# Patient Record
Sex: Male | Born: 1960 | Race: Black or African American | Hispanic: No | Marital: Single | Smoking: Current every day smoker
Health system: Southern US, Community
[De-identification: ages and names within clinical notes are randomized; demographics above are authoritative.]

## PROBLEM LIST (undated history)

## (undated) DIAGNOSIS — R011 Cardiac murmur, unspecified: Secondary | ICD-10-CM

## (undated) DIAGNOSIS — F1910 Other psychoactive substance abuse, uncomplicated: Secondary | ICD-10-CM

## (undated) DIAGNOSIS — M109 Gout, unspecified: Secondary | ICD-10-CM

## (undated) DIAGNOSIS — I10 Essential (primary) hypertension: Secondary | ICD-10-CM

## (undated) HISTORY — DX: Gout, unspecified: M10.9

## (undated) HISTORY — PX: APPENDECTOMY: SHX54

## (undated) HISTORY — DX: Cardiac murmur, unspecified: R01.1

---

## 2005-05-06 ENCOUNTER — Emergency Department: Payer: Self-pay | Admitting: Emergency Medicine

## 2005-12-12 ENCOUNTER — Emergency Department: Payer: Self-pay | Admitting: Emergency Medicine

## 2007-10-08 ENCOUNTER — Emergency Department: Payer: Self-pay | Admitting: Emergency Medicine

## 2008-02-20 ENCOUNTER — Emergency Department: Payer: Self-pay | Admitting: Emergency Medicine

## 2016-04-17 ENCOUNTER — Inpatient Hospital Stay
Admission: EM | Admit: 2016-04-17 | Discharge: 2016-04-19 | DRG: 812 | Disposition: A | Discharge status: Home / Self Care | Payer: BLUE CROSS/BLUE SHIELD | Attending: Internal Medicine | Admitting: Internal Medicine

## 2016-04-17 ENCOUNTER — Encounter: Payer: Self-pay | Admitting: Emergency Medicine

## 2016-04-17 ENCOUNTER — Emergency Department: Payer: BLUE CROSS/BLUE SHIELD

## 2016-04-17 DIAGNOSIS — I10 Essential (primary) hypertension: Secondary | ICD-10-CM | POA: Diagnosis present

## 2016-04-17 DIAGNOSIS — R072 Precordial pain: Secondary | ICD-10-CM | POA: Diagnosis not present

## 2016-04-17 DIAGNOSIS — R0789 Other chest pain: Secondary | ICD-10-CM | POA: Diagnosis present

## 2016-04-17 DIAGNOSIS — R079 Chest pain, unspecified: Secondary | ICD-10-CM | POA: Diagnosis present

## 2016-04-17 DIAGNOSIS — D509 Iron deficiency anemia, unspecified: Principal | ICD-10-CM | POA: Diagnosis present

## 2016-04-17 DIAGNOSIS — K3189 Other diseases of stomach and duodenum: Secondary | ICD-10-CM

## 2016-04-17 DIAGNOSIS — F17210 Nicotine dependence, cigarettes, uncomplicated: Secondary | ICD-10-CM | POA: Diagnosis present

## 2016-04-17 DIAGNOSIS — K921 Melena: Secondary | ICD-10-CM | POA: Diagnosis not present

## 2016-04-17 DIAGNOSIS — Z8249 Family history of ischemic heart disease and other diseases of the circulatory system: Secondary | ICD-10-CM | POA: Diagnosis not present

## 2016-04-17 DIAGNOSIS — D649 Anemia, unspecified: Secondary | ICD-10-CM

## 2016-04-17 HISTORY — DX: Essential (primary) hypertension: I10

## 2016-04-17 HISTORY — DX: Other psychoactive substance abuse, uncomplicated: F19.10

## 2016-04-17 LAB — TROPONIN I: Troponin I: <0.03 ng/mL (ref <0.03)

## 2016-04-17 LAB — BASIC METABOLIC PANEL
ANION GAP: 10 (ref 5–15)
BUN: 8 mg/dL (ref 6–20)
CALCIUM: 9.1 mg/dL (ref 8.9–10.3)
CO2: 21 mmol/L — AB (ref 22–32)
Chloride: 107 mmol/L (ref 101–111)
Creatinine, Ser: 0.77 mg/dL (ref 0.61–1.24)
GFR calc Af Amer: >60 mL/min (ref >60)
GFR calc non Af Amer: >60 mL/min (ref >60)
GLUCOSE: 90 mg/dL (ref 65–99)
Potassium: 4.1 mmol/L (ref 3.5–5.1)
Sodium: 138 mmol/L (ref 135–145)

## 2016-04-17 LAB — CBC
HEMATOCRIT: 26.6 % — AB (ref 40.0–52.0)
HEMOGLOBIN: 7.9 g/dL — AB (ref 13.0–18.0)
MCH: 19.9 pg — AB (ref 26.0–34.0)
MCHC: 29.7 g/dL — AB (ref 32.0–36.0)
MCV: 67 fL — ABNORMAL LOW (ref 80.0–100.0)
Platelets: 242 10*3/uL (ref 150–440)
RBC: 3.98 MIL/uL — ABNORMAL LOW (ref 4.40–5.90)
RDW: 20.2 % — AB (ref 11.5–14.5)
WBC: 10.1 10*3/uL (ref 3.8–10.6)

## 2016-04-17 LAB — ABO/RH: ABO/RH(D): A POS

## 2016-04-17 MED ORDER — SODIUM CHLORIDE 0.9 % IV SOLN
10.0000 mL/h | Freq: Once | INTRAVENOUS | Status: AC
  Administered 2016-04-17: 10 mL/h via INTRAVENOUS

## 2016-04-17 MED ORDER — MORPHINE SULFATE (PF) 4 MG/ML IV SOLN
4.0000 mg | Freq: Once | INTRAVENOUS | Status: AC
  Administered 2016-04-17: 4 mg via INTRAVENOUS

## 2016-04-17 MED ORDER — IOPAMIDOL (ISOVUE-370) INJECTION 76%
75.0000 mL | Freq: Once | INTRAVENOUS | Status: AC | PRN
  Administered 2016-04-17: 75 mL via INTRAVENOUS

## 2016-04-17 MED ORDER — MORPHINE SULFATE (PF) 4 MG/ML IV SOLN
INTRAVENOUS | Status: AC
  Filled 2016-04-17: qty 1

## 2016-04-17 NOTE — H&P (Signed)
[facility] [place] NAME: Dillon Castillo    MR#:  [id number]  DATE OF BIRTH:  03-14-1961  DATE OF ADMISSION:  04/17/2016  PRIMARY CARE PHYSICIAN: No PCP Per Patient   REQUESTING/REFERRING PHYSICIAN: Alfred Levins, MD  CHIEF COMPLAINT:   Chief Complaint  Patient presents with  . Chest Pain    HISTORY OF PRESENT ILLNESS:  Dillon Castillo  is a 55 y.o. male who presents with An episode of central chest discomfort which radiated through to his back. This started while he was driving to work today, but he noticed that it was exacerbated with any exertion and relieved some with rest. He came to the ED for evaluation was found to be significantly anemic, with a hemoglobin of 7.9. He has no prior known history of anemia. He does state that he had some diarrhea yesterday, and that his first stool was large volume and black. Subsequent stool was still diarrhea, but brown. Initial cardiac workup in the ED is negative, but given his symptomatic anemia hospitalists were called for admission  PAST MEDICAL HISTORY:   Past Medical History:  Diagnosis Date  . Hypertension     PAST SURGICAL HISTORY:   Past Surgical History:  Procedure Laterality Date  . APPENDECTOMY      SOCIAL HISTORY:   Social History  Substance Use Topics  . Smoking status: Current Every Day Smoker    Packs/day: 0.50    Types: Cigarettes  . Smokeless tobacco: Never Used  . Alcohol use Yes     Comment: beer    FAMILY HISTORY:   Family History  Problem Relation Age of Onset  . Hypertension Mother   . Heart disease Father   . Heart attack Brother     DRUG ALLERGIES:  No Known Allergies  MEDICATIONS AT HOME:   Prior to Admission medications   Not on File    REVIEW OF SYSTEMS:  Review of Systems  Constitutional: Negative for chills, fever, malaise/fatigue and weight loss.  HENT: Negative for ear pain, hearing loss and tinnitus.   Eyes: Negative for blurred  vision, double vision, pain and redness.  Respiratory: Negative for cough, hemoptysis and shortness of breath.   Cardiovascular: Positive for chest pain. Negative for palpitations, orthopnea and leg swelling.  Gastrointestinal: Positive for melena. Negative for abdominal pain, constipation, diarrhea, nausea and vomiting.  Genitourinary: Negative for dysuria, frequency and hematuria.  Musculoskeletal: Negative for back pain, joint pain and neck pain.  Skin:       No acne, rash, or lesions  Neurological: Negative for dizziness, tremors, focal weakness and weakness.  Endo/Heme/Allergies: Negative for polydipsia. Does not bruise/bleed easily.  Psychiatric/Behavioral: Negative for depression. The patient is not nervous/anxious and does not have insomnia.      VITAL SIGNS:   Vitals:   04/17/16 2000 04/17/16 2030 04/17/16 2218 04/17/16 2236  BP: (!) 178/95 (!) 190/97 (!) 173/98 (!) 173/98  Pulse:  (!) 111 (!) 105 (!) 105  Resp: (!) 22 10 17 17   Temp:   98 F (36.7 C) 98 F (36.7 C)  TempSrc:    Oral  SpO2:  100% 100% 100%  Weight:      Height:       Wt Readings from Last 3 Encounters:  04/17/16 68 kg (150 lb)    PHYSICAL EXAMINATION:  Physical Exam  Vitals reviewed. Constitutional: He is oriented to person, place, and time. He appears well-developed and well-nourished. No distress.  HENT:  Head: Normocephalic and atraumatic.  Mouth/Throat: Oropharynx is clear and moist.  Eyes: Conjunctivae and EOM are normal. Pupils are equal, round, and reactive to light. No scleral icterus.  Neck: Normal range of motion. Neck supple. No JVD present. No thyromegaly present.  Cardiovascular: Regular rhythm and intact distal pulses.  Exam reveals no gallop and no friction rub.   No murmur heard. tachycardic  Respiratory: Effort normal and breath sounds normal. No respiratory distress. He has no wheezes. He has no rales.  GI: Soft. Bowel sounds are normal. He exhibits no distension. There is no  tenderness.  Musculoskeletal: Normal range of motion. He exhibits no edema.  No arthritis, no gout  Lymphadenopathy:    He has no cervical adenopathy.  Neurological: He is alert and oriented to person, place, and time. No cranial nerve deficit.  No dysarthria, no aphasia  Skin: Skin is warm and dry. No rash noted. No erythema.  Psychiatric: He has a normal mood and affect. His behavior is normal. Judgment and thought content normal.    LABORATORY PANEL:   CBC  Recent Labs Lab 04/17/16 1618  WBC 10.1  HGB 7.9*  HCT 26.6*  PLT 242   ------------------------------------------------------------------------------------------------------------------  Chemistries   Recent Labs Lab 04/17/16 1618  NA 138  K 4.1  CL 107  CO2 21*  GLUCOSE 90  BUN 8  CREATININE 0.77  CALCIUM 9.1   ------------------------------------------------------------------------------------------------------------------  Cardiac Enzymes  Recent Labs Lab 04/17/16 2013  TROPONINI <0.03   ------------------------------------------------------------------------------------------------------------------  RADIOLOGY:  Dg Chest 2 View  Result Date: 04/17/2016 CLINICAL DATA:  Anterior chest pain and upper back pain. EXAM: CHEST  2 VIEW COMPARISON:  None. FINDINGS: The heart size and mediastinal contours are within normal limits. Both lungs are clear. The visualized skeletal structures are unremarkable. IMPRESSION: No active cardiopulmonary disease. Electronically Signed   By: Fidela Salisbury M.D.   On: 04/17/2016 17:07   Ct Angio Chest Pe W And/or Wo Contrast  Result Date: 04/17/2016 CLINICAL DATA:  Chest pain, mid back pain EXAM: CT ANGIOGRAPHY CHEST WITH CONTRAST TECHNIQUE: Multidetector CT imaging of the chest was performed using the standard protocol during bolus administration of intravenous contrast. Multiplanar CT image reconstructions and MIPs were obtained to evaluate the vascular anatomy.  CONTRAST:  75 mL Isovue 370 COMPARISON:  None. FINDINGS: Mediastinum/Lymph Nodes: No pulmonary emboli or thoracic aortic dissection identified. No masses or pathologically enlarged lymph nodes identified. 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 Lungs/Pleura: No pulmonary mass, infiltrate, or effusion. Upper abdomen: No acute findings. Musculoskeletal: No chest wall mass or suspicious bone lesions identified. Dextrocurvature of the thoracic spine. Review of the MIP images confirms the above findings. IMPRESSION: 1. No pulmonary embolus. 2. No thoracic aortic dissection. Electronically Signed   By: Kathreen Devoid   On: 04/17/2016 20:59    EKG:   Orders placed or performed during the hospital encounter of 04/17/16  . ED EKG within 10 minutes  . ED EKG within 10 minutes  . EKG 12-Lead  . EKG 12-Lead    IMPRESSION AND PLAN:  Principal Problem:   Chest pain - patient has significant family history of heart disease. So far he has 2 negative troponins in the ED and an EKG without significant ischemic changes. However his symptoms were somewhat atypical in that they were exertional, central, radiated to his back. CT showed no dissection or other acute pathology. We will trend his enzymes tonight, get an echocardiogram and a cardiology consult in the morning Active  Problems:   Symptomatic anemia - with an episode of melena yesterday, there is suspicion for GI blood loss. He is receiving a transfusion given that he is having chest pain in conjunction with his anemia. We've ordered twice a day PPI for now, and a GI consult   Melena - one episode yesterday, twice a day PPI as well as other workup as above   HTN (hypertension) - not on medications at home for this, we'll keep a close eye on it here and treat when necessary  All the records are reviewed and case discussed with ED provider. Management plans discussed with the patient and/or family.  DVT PROPHYLAXIS: Mechanical only  GI  PROPHYLAXIS: PPI  ADMISSION STATUS: Inpatient  CODE STATUS: Full Code Status History    This patient does not have a recorded code status. Please follow your organizational policy for patients in this situation.      TOTAL TIME TAKING CARE OF THIS PATIENT: 45 minutes.    Dillon Castillo 04/17/2016, 10:47 PM  [facility]  Office  [phone]  CC: Primary care physician; No PCP Per Patient

## 2016-04-17 NOTE — ED Provider Notes (Addendum)
[facility] Emergency Department Provider Note  ____________________________________________  Time seen: Approximately 7:44 PM  I have reviewed the triage vital signs and the nursing notes.   HISTORY  Chief Complaint Chest Pain   HPI Dillon Castillo is a 55 y.o. male no significant past medical history who presents for evaluation of chest pain. Patient reports that he was driving to work earlier this morning when he developed the onset of substernal chest pain that has been constant since this morning. He describes the pain as somebody hitting him with a hammer in the middle of his chest that radiates to his back, constant, pleuritic, 8 out of 10, associated with nausea. He denies shortness of breath, dizziness, abdominal pain, syncope, fever, cough. Patient hasn't seen a doctor in 3 years. Has a strong family history of ischemic heart disease on father and brother. He is a smoker. He reports that the pain is not worse by exacerbation. Patient reports his never had pain like this before. No personal or family history of blood clots, no recent travel or immobilization, no leg pain or swelling, no exogenous hormones. Patient reports 2 watery bowel movements one black yesterday but the other one was brown today. No hematuria, no hemoptysis, no history of GI bleeds, no blood thinners, no NSAIDs. He does drink 2 x 40 oz beers a day.   Past Medical History:  Diagnosis Date  . Hypertension     There are no active problems to display for this patient.   History reviewed. No pertinent surgical history.  Prior to Admission medications   Not on File    Allergies Review of patient's allergies indicates no known allergies.  No family history on file.  Social History Social History  Substance Use Topics  . Smoking status: Current Every Day Smoker    Packs/day: 0.50    Types: Cigarettes  . Smokeless tobacco: Never Used  . Alcohol use Yes     Comment: beer     Review of Systems  Constitutional: Negative for fever. Eyes: Negative for visual changes. ENT: Negative for sore throat. Cardiovascular: + chest pain. Respiratory: Negative for shortness of breath. Gastrointestinal: Negative for abdominal pain, vomiting. + diarrhea and nausea Genitourinary: Negative for dysuria. Musculoskeletal: Negative for back pain. Skin: Negative for rash. Neurological: Negative for headaches, weakness or numbness.  ____________________________________________   PHYSICAL EXAM:  VITAL SIGNS: ED Triage Vitals  Enc Vitals Group     BP 04/17/16 1607 (!) 156/85     Pulse Rate 04/17/16 1607 (!) 114     Resp 04/17/16 1607 16     Temp 04/17/16 1607 97.9 F (36.6 C)     Temp Source 04/17/16 1607 Oral     SpO2 04/17/16 1607 98 %     Weight 04/17/16 1608 150 lb (68 kg)     Height 04/17/16 1608 5\' 9"  (1.753 m)     Head Circumference --      Peak Flow --      Pain Score 04/17/16 1614 8     Pain Loc --      Pain Edu? --      Excl. in [place]? --     Constitutional: Alert and oriented. Well appearing and in no apparent distress. HEENT:      Head: Normocephalic and atraumatic.         Eyes: Conjunctivae are normal. Sclera is non-icteric. EOMI. PERRL      Mouth/Throat: Mucous membranes are moist.  Neck: Supple with no signs of meningismus. Cardiovascular: Tachycardic with regular rhythm. No murmurs, gallops, or rubs. 2+ symmetrical distal pulses are present in all extremities. No JVD. Respiratory: Normal respiratory effort. Lungs are clear to auscultation bilaterally. No wheezes, crackles, or rhonchi.  Gastrointestinal: Soft, non tender, and non distended with positive bowel sounds. No rebound or guarding. Genitourinary: No CVA tenderness. Rectal exam showing brown stool Hemoccult negative. Musculoskeletal: Nontender with normal range of motion in all extremities. No edema, cyanosis, or erythema of extremities. Neurologic: Normal speech and language. Face is  symmetric. Moving all extremities. No gross focal neurologic deficits are appreciated. Skin: Skin is warm, dry and intact. No rash noted. Psychiatric: Mood and affect are normal. Speech and behavior are normal.  ____________________________________________   LABS (all labs ordered are listed, but only abnormal results are displayed)  Labs Reviewed  BASIC METABOLIC PANEL - Abnormal; Notable for the following:       Result Value   CO2 21 (*)    All other components within normal limits  CBC - Abnormal; Notable for the following:    RBC 3.98 (*)    Hemoglobin 7.9 (*)    HCT 26.6 (*)    MCV 67.0 (*)    MCH 19.9 (*)    MCHC 29.7 (*)    RDW 20.2 (*)    All other components within normal limits  TROPONIN I  TROPONIN I  TYPE AND SCREEN  ABO/RH  PREPARE RBC (CROSSMATCH)   ____________________________________________  EKG  ED ECG REPORT I, Rudene Re, the attending physician, personally viewed and interpreted this ECG.  Sinus tachycardia, rate of 106, normal intervals, normal axis, no ST elevations or depressions. No prior for comparison. ____________________________________________  [id number]  CTA chest: negative ____________________________________________   PROCEDURES  Procedure(s) performed: None Procedures Critical Care performed:  Yes  CRITICAL CARE Performed by: Rudene Re  ?  Total critical care time: 35 min  Critical care time was exclusive of separately billable procedures and treating other patients.  Critical care was necessary to treat or prevent imminent or life-threatening deterioration.  Critical care was time spent personally by me on the following activities: development of treatment plan with patient and/or surrogate as well as nursing, discussions with consultants, evaluation of patient's response to treatment, examination of patient, obtaining history from patient or surrogate, ordering and performing treatments and interventions,  ordering and review of laboratory studies, ordering and review of radiographic studies, pulse oximetry and re-evaluation of patient's condition.  ____________________________________________   INITIAL IMPRESSION / ASSESSMENT AND PLAN / ED COURSE  55 y.o. male no significant past medical history who presents for evaluation of substernal, dull constant chest pain since this morning, pleuritic in nature. Patient here is tachycardic and hypertensive. His initial labs show a hemoglobin of 7.9. No signs or symptoms of bleeding on the rectal exam was negative. Patient has no known history of anemia. His EKG shows sinus tachycardia with no evidence of ischemia. First troponin is negative. Differential diagnosis for the chest pain including ischemia from anemia vs PE with pleuritic and tachycardia, could also consider dissection however patient has stronger equal distal pulses and no widened mediastinum on chest x-ray. Will prepare 2 U of pRBC as patient has CP and tachycardia here. Will pursue CT chest to rule out PE. Bedside ultrasound showing no evidence of AAA or intra-abdominal fluid.  Clinical Course  Comment By Time  CT negative for PE or dissection. Troponin 2 negative. Patient awaiting transfusion. We'll admit to the  hospitalist. Rudene Re, MD 08/27 2121    Pertinent labs & imaging results that were available during my care of the patient were reviewed by me and considered in my medical decision making (see chart for details).    ____________________________________________   FINAL CLINICAL IMPRESSION(S) / ED DIAGNOSES  Final diagnoses:  Chest pain, unspecified chest pain type  Anemia, unspecified anemia type      NEW MEDICATIONS STARTED DURING THIS VISIT:  New Prescriptions   No medications on file     Note:  This document was prepared using Dragon voice recognition software and may include unintentional dictation errors.      Rudene Re, MD 04/18/16  2039

## 2016-04-17 NOTE — ED Triage Notes (Signed)
C/O chest pain and mid back.  Onset of symptoms this morning at 0800.  Denies SOB.

## 2016-04-18 ENCOUNTER — Inpatient Hospital Stay (HOSPITAL_BASED_OUTPATIENT_CLINIC_OR_DEPARTMENT_OTHER): Payer: BLUE CROSS/BLUE SHIELD

## 2016-04-18 ENCOUNTER — Inpatient Hospital Stay (HOSPITAL_COMMUNITY)
Admit: 2016-04-18 | Discharge: 2016-04-18 | Disposition: A | Discharge status: Home / Self Care | Payer: BLUE CROSS/BLUE SHIELD | Attending: Internal Medicine | Admitting: Internal Medicine

## 2016-04-18 DIAGNOSIS — K921 Melena: Secondary | ICD-10-CM

## 2016-04-18 DIAGNOSIS — R079 Chest pain, unspecified: Secondary | ICD-10-CM

## 2016-04-18 DIAGNOSIS — D649 Anemia, unspecified: Secondary | ICD-10-CM

## 2016-04-18 DIAGNOSIS — R072 Precordial pain: Secondary | ICD-10-CM

## 2016-04-18 LAB — NM MYOCAR MULTI W/SPECT W/WALL MOTION / EF
CHL CUP NUCLEAR SDS: 0
CHL CUP NUCLEAR SRS: 1
CHL CUP RESTING HR STRESS: 87 {beats}/min
CHL CUP STRESS STAGE 2 GRADE: 0 %
CHL CUP STRESS STAGE 2 SPEED: 0 mph
CHL CUP STRESS STAGE 3 GRADE: 0 %
CHL CUP STRESS STAGE 3 HR: 85 {beats}/min
CHL CUP STRESS STAGE 3 SPEED: 0 mph
CHL CUP STRESS STAGE 4 SPEED: 0 mph
CHL CUP STRESS STAGE 5 DBP: 78 mmHg
CHL CUP STRESS STAGE 5 GRADE: 0 %
CHL CUP STRESS STAGE 5 SPEED: 0 mph
CHL CUP STRESS STAGE 6 DBP: 78 mmHg
CHL CUP STRESS STAGE 6 GRADE: 0 %
CHL CUP STRESS STAGE 6 HR: 107 {beats}/min
CHL CUP STRESS STAGE 6 SBP: 187 mmHg
CHL CUP STRESS STAGE 6 SPEED: 0 mph
Estimated workload: 1 METS
LVDIAVOL: 113 mL (ref 62–150)
LVSYSVOL: 65 mL
Peak HR: 136 {beats}/min
Percent HR: 87 %
Percent of predicted max HR: 82 %
SSS: 0
Stage 1 HR: 86 {beats}/min
Stage 2 HR: 85 {beats}/min
Stage 4 Grade: 0 %
Stage 4 HR: 136 {beats}/min
Stage 5 HR: 130 {beats}/min
Stage 5 SBP: 187 mmHg
TID: 1

## 2016-04-18 LAB — CBC
HEMATOCRIT: 27.6 % — AB (ref 40.0–52.0)
HEMOGLOBIN: 8.4 g/dL — AB (ref 13.0–18.0)
MCH: 20.9 pg — ABNORMAL LOW (ref 26.0–34.0)
MCHC: 30.6 g/dL — AB (ref 32.0–36.0)
MCV: 68.3 fL — ABNORMAL LOW (ref 80.0–100.0)
Platelets: 209 10*3/uL (ref 150–440)
RBC: 4.04 MIL/uL — AB (ref 4.40–5.90)
RDW: 21.5 % — ABNORMAL HIGH (ref 11.5–14.5)
WBC: 7.3 10*3/uL (ref 3.8–10.6)

## 2016-04-18 LAB — BASIC METABOLIC PANEL
Anion gap: 6 (ref 5–15)
BUN: 7 mg/dL (ref 6–20)
CO2: 23 mmol/L (ref 22–32)
Calcium: 8.5 mg/dL — ABNORMAL LOW (ref 8.9–10.3)
Chloride: 108 mmol/L (ref 101–111)
Creatinine, Ser: 0.66 mg/dL (ref 0.61–1.24)
GFR calc Af Amer: >60 mL/min (ref >60)
GLUCOSE: 99 mg/dL (ref 65–99)
POTASSIUM: 3.6 mmol/L (ref 3.5–5.1)
Sodium: 137 mmol/L (ref 135–145)

## 2016-04-18 LAB — IRON AND TIBC
IRON: 60 ug/dL (ref 45–182)
Saturation Ratios: 13 % — ABNORMAL LOW (ref 17.9–39.5)
TIBC: 465 ug/dL — AB (ref 250–450)
UIBC: 405 ug/dL

## 2016-04-18 LAB — ECHOCARDIOGRAM COMPLETE
HEIGHTINCHES: 69 in
WEIGHTICAEL: 2388.8 [oz_av]

## 2016-04-18 LAB — TROPONIN I

## 2016-04-18 LAB — FERRITIN: FERRITIN: 4 ng/mL — AB (ref 24–336)

## 2016-04-18 MED ORDER — ACETAMINOPHEN 325 MG PO TABS: 650.0000 mg | ORAL_TABLET | Freq: Four times a day (QID) | ORAL | Status: DC | PRN

## 2016-04-18 MED ORDER — TECHNETIUM TC 99M TETROFOSMIN IV KIT
32.3100 | PACK | Freq: Once | INTRAVENOUS | Status: AC | PRN
  Administered 2016-04-18: 32.31 via INTRAVENOUS

## 2016-04-18 MED ORDER — OXYCODONE HCL 5 MG PO TABS: 5.0000 mg | ORAL_TABLET | ORAL | Status: DC | PRN

## 2016-04-18 MED ORDER — MORPHINE SULFATE (PF) 4 MG/ML IV SOLN: 4.0000 mg | INTRAVENOUS | Status: DC | PRN

## 2016-04-18 MED ORDER — ACETAMINOPHEN 650 MG RE SUPP: 650.0000 mg | Freq: Four times a day (QID) | RECTAL | Status: DC | PRN

## 2016-04-18 MED ORDER — PANTOPRAZOLE SODIUM 40 MG PO TBEC
40.0000 mg | DELAYED_RELEASE_TABLET | Freq: Two times a day (BID) | ORAL | Status: DC
  Administered 2016-04-18 (×3): 40 mg via ORAL
  Filled 2016-04-18 (×3): qty 1

## 2016-04-18 MED ORDER — SODIUM CHLORIDE 0.9% FLUSH
3.0000 mL | Freq: Two times a day (BID) | INTRAVENOUS | Status: DC
  Administered 2016-04-18 (×2): 3 mL via INTRAVENOUS

## 2016-04-18 MED ORDER — ONDANSETRON HCL 4 MG/2ML IJ SOLN: 4.0000 mg | Freq: Four times a day (QID) | INTRAMUSCULAR | Status: DC | PRN

## 2016-04-18 MED ORDER — REGADENOSON 0.4 MG/5ML IV SOLN
0.4000 mg | Freq: Once | INTRAVENOUS | Status: AC
  Administered 2016-04-18: 0.4 mg via INTRAVENOUS

## 2016-04-18 MED ORDER — ONDANSETRON HCL 4 MG PO TABS: 4.0000 mg | ORAL_TABLET | Freq: Four times a day (QID) | ORAL | Status: DC | PRN

## 2016-04-18 MED ORDER — TECHNETIUM TC 99M TETROFOSMIN IV KIT
13.8400 | PACK | Freq: Once | INTRAVENOUS | Status: AC | PRN
  Administered 2016-04-18: 13.84 via INTRAVENOUS

## 2016-04-18 NOTE — Progress Notes (Signed)
Patient arrived to 2A Room 246. Patient denies pain and all questions answered. Patient oriented to unit and Fall Safety Plan signed. Skin assessment completed with Lattie Haw RN and skin intact. A&Ox4, VSS, and NSR on verified tele-box #[id number]. Blood transfusion was completed upon arrival to the floor. Nursing staff will continue to monitor for any changes in patient status. Earleen Reaper, RN

## 2016-04-18 NOTE — Consult Note (Signed)
Cardiology Consultation Note  Patient ID: Dillon Castillo, MRN: [id number], DOB/AGE: 01/09/1961 55 y.o. Admit date: 04/17/2016   Date of Consult: 04/18/2016 Primary Physician: No PCP Per Patient Primary Cardiologist: New to [facility] Requesting Physician: Dr. Jannifer Franklin, MD  Chief Complaint: Chest pain Reason for Consult: Chest pain  HPI: 55 y.o. male with h/o HTN and ongoing tobacco abuse presented to [facility] on 8/27 after developing chest pain radiating to the back while at work.   No prior known cardiac pathology. He does report having had a stress test years ago though does not remember why. 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 Subsequent episodes were back to baseline from a color standpoint, though still with diarrhea multiple times on 8/26 (none since). While he was at work on 8/27 he developed sudden onset of chest pain that radiated to his back. There was no associated diaphoresis, nausea, or vomiting. No SOB. Some dizziness. No syncope. He has never had chest pain like this before. He smokes 5 cigarettes daily and has done so for 20+ years. He drinks 2 forty oz beers daily and has a remote history of marijuana abuse in his 20's, none since and denies any other illegal drugs of abuse.   Upon the patient's arrival to [facility] they were found to have troponin negative x 3, hgb 7.9-->8.4 s/p transfusion of 1 unit pRBC, K+4.1-->3.6, SCr 0.77. ECG as below, CXR showed no active disease, CTA chest was negative for PE or acute aortic pathology. He has been without chest pain since receiving morphine in the ED on 8/27. GI has been consulted for evaluation of his melena.    Past Medical History:  Diagnosis Date  . Hypertension   . Polysubstance abuse    a. ongoing tobacco and etoh abuse; b. remote marijuana abuse in his 20's      Most Recent Cardiac Studies: None   Surgical History:  Past Surgical History:    Procedure Laterality Date  . APPENDECTOMY       Home Meds: Prior to Admission medications   Not on File    Inpatient Medications:  . pantoprazole  40 mg Oral BID  . sodium chloride flush  3 mL Intravenous Q12H      Allergies: No Known Allergies  Social History   Social History  . Marital status: Single    Spouse name: N/A  . Number of children: N/A  . Years of education: N/A   Occupational History  . Not on file.   Social History Main Topics  . Smoking status: Current Every Day Smoker    Packs/day: 0.50    Types: Cigarettes  . Smokeless tobacco: Never Used  . Alcohol use Yes     Comment: beer  . Drug use: No  . Sexual activity: Not on file   Other Topics Concern  . Not on file   Social History Narrative  . No narrative on file     Family History  Problem Relation Age of Onset  . Hypertension Mother   . Heart disease Father   . Heart attack Brother      Review of Systems: Review of Systems  Constitutional: Positive for malaise/fatigue. Negative for chills, diaphoresis, fever and weight loss.  HENT: Negative for congestion.   Eyes: Negative for discharge and redness.  Respiratory: Positive for shortness of breath. Negative for cough, hemoptysis, sputum production and wheezing.   Cardiovascular: Positive  for chest pain. Negative for palpitations, orthopnea, claudication, leg swelling and PND.  Gastrointestinal: Positive for melena. Negative for abdominal pain, blood in stool, heartburn, nausea and vomiting.  Genitourinary: Negative for hematuria.  Musculoskeletal: Negative for falls and myalgias.  Skin: Negative for rash.  Neurological: Positive for weakness. Negative for dizziness, tingling, tremors, sensory change, speech change, focal weakness and loss of consciousness.  Endo/Heme/Allergies: Does not bruise/bleed easily.  Psychiatric/Behavioral: Negative for substance abuse. The patient is not nervous/anxious.   All other systems reviewed and are  negative.   Labs:  Recent Labs  04/17/16 1618 04/17/16 2013 04/18/16 0248  TROPONINI <0.03 <0.03 <0.03   Lab Results  Component Value Date   WBC 7.3 04/18/2016   HGB 8.4 (L) 04/18/2016   HCT 27.6 (L) 04/18/2016   MCV 68.3 (L) 04/18/2016   PLT 209 04/18/2016     Recent Labs Lab 04/18/16 0248  NA 137  K 3.6  CL 108  CO2 23  BUN 7  CREATININE 0.66  CALCIUM 8.5*  GLUCOSE 99   No results found for: CHOL, HDL, LDLCALC, TRIG No results found for: DDIMER  Radiology/Studies:  Dg Chest 2 View  Result Date: 04/17/2016 IMPRESSION: No active cardiopulmonary disease. Electronically Signed   By: Fidela Salisbury M.D.   On: 04/17/2016 17:07   Ct Angio Chest Pe W And/or Wo Contrast  Result Date: 04/17/2016 IMPRESSION: 1. No pulmonary embolus. 2. No thoracic aortic dissection. Electronically Signed   By: Kathreen Devoid   On: 04/17/2016 20:59    EKG: Interpreted by me showed: sinus tachycardia, 106 bpm, nonspecific diffuse st/t changes Telemetry: Interpreted by me showed: sinus tachycardia, low 100's bpm  Weights: Filed Weights   04/17/16 1608 04/18/16 0042  Weight: 150 lb (68 kg) 149 lb 4.8 oz (67.7 kg)     Physical Exam: Blood pressure (!) 165/93, pulse (!) 105, temperature 98 F (36.7 C), temperature source Oral, resp. rate 16, height 5\' 9"  (1.753 m), weight 149 lb 4.8 oz (67.7 kg), SpO2 94 %. Body mass index is 22.05 kg/m. General: Well developed, well nourished, in no acute distress. Head: Normocephalic, atraumatic, sclera non-icteric, no xanthomas, nares are without discharge.  Neck: Negative for carotid bruits. JVD not elevated. Lungs: Clear bilaterally to auscultation without wheezes, rales, or rhonchi. Breathing is unlabored. Heart: Tachycardic with S1 S2. No murmurs, rubs, or gallops appreciated. Abdomen: Soft, non-tender, non-distended with normoactive bowel sounds. No hepatomegaly. No rebound/guarding. No obvious abdominal masses. Msk:  Strength and tone  appear normal for age. Extremities: No clubbing or cyanosis. No edema. Distal pedal pulses are 2+ and equal bilaterally. Neuro: Alert and oriented X 3. No facial asymmetry. No focal deficit. Moves all extremities spontaneously. Psych:  Responds to questions appropriately with a normal affect.    Assessment and Plan:  Principal Problem:   Chest pain Active Problems:   Symptomatic anemia   HTN (hypertension)   Melena    1. Atypical chest pain: -Possibly in the setting of his newly found anemia of 7.9 -Troponin negative -CTA negative for acute pathology -Schedule Lexiscan Myoview to evaluate for high-risk ischemia -Hold aspirin at this time given anemia  2. Acute symptomatic anemia: -Status post transfusion of 1 unit pRBC -GI has been consulted -PPI  3. HTN: -Controlled  4. Polysubstance abuse: -Cessation advised of tobacco and etoh   Signed, Christell Faith, PA-C [facility] Pager: [phone] 04/18/2016, 9:12 AM

## 2016-04-18 NOTE — Progress Notes (Signed)
*  PRELIMINARY RESULTS* Echocardiogram 2D Echocardiogram has been performed.  Sherrie Sport 04/18/2016, 10:41 AM

## 2016-04-18 NOTE — Consult Note (Signed)
Dillon Lame, MD [address] [place], [place] [address] Phone: [phone] Fax : [phone]  Consultation  Referring Provider:     No ref. provider found Primary Care Physician:  No PCP Per Patient Primary Gastroenterologist:  None         Reason for Consultation:     Anemia  Date of Admission:  04/17/2016 Date of Consultation:  04/18/2016         HPI:   Dillon Castillo is a 55 y.o. male who was admitted with symptomatic anemia and chest pain.  The patient had a cardiac workup that was negative.  The patient denies any bloody stools or abdominal pain.  He also denies any family history of colon cancer colon polyps.  The patient denies ever having a GI workup or being told he had anemia in the past.  The patient was transfused 1 unit of blood and states that he is feeling well now. After the patient had his cardiac stress test today the patient was then given a regular diet and just finished dinner when I saw the patient.  He does report that last week he had some dark stools that he reports to be diarrhea in consistency.  I'm now being asked to see the patient for anemia.  Past Medical History:  Diagnosis Date  . Hypertension   . Polysubstance abuse    a. ongoing tobacco and etoh abuse; b. remote marijuana abuse in his 20's    Past Surgical History:  Procedure Laterality Date  . APPENDECTOMY      Prior to Admission medications   Not on File    Family History  Problem Relation Age of Onset  . Hypertension Mother   . Heart disease Father   . Heart attack Brother      Social History  Substance Use Topics  . Smoking status: Current Every Day Smoker    Packs/day: 0.50    Types: Cigarettes  . Smokeless tobacco: Never Used  . Alcohol use Yes     Comment: beer    Allergies as of 04/17/2016  . (No Known Allergies)    Review of Systems:    All systems reviewed and negative except where noted in HPI.   Physical Exam:  Vital signs in last 24  hours: Temp:  [97.8 F (36.6 C)-98.5 F (36.9 C)] 98 F (36.7 C) (08/28 1114) Pulse Rate:  [85-115] 85 (08/28 1114) Resp:  [10-22] 18 (08/28 1114) BP: (157-190)/(80-98) 157/87 (08/28 1114) SpO2:  [94 %-100 %] 96 % (08/28 1114) Weight:  [149 lb 4.8 oz (67.7 kg)] 149 lb 4.8 oz (67.7 kg) (08/28 0042) Last BM Date: 04/16/16 General:   Pleasant, cooperative in NAD Head:  Normocephalic and atraumatic. Eyes:   No icterus.   Conjunctiva pink. PERRLA. Ears:  Normal auditory acuity. Neck:  Supple; no masses or thyroidomegaly Lungs: Respirations even and unlabored. Lungs clear to auscultation bilaterally.   No wheezes, crackles, or rhonchi.  Heart:  Regular rate and rhythm;  Without murmur, clicks, rubs or gallops Abdomen:  Soft, nondistended, nontender. Normal bowel sounds. No appreciable masses or hepatomegaly.  No rebound or guarding.  Rectal:  Not performed. Msk:  Symmetrical without gross deformities.  Strength  Extremities:  Without edema, cyanosis or clubbing. Neurologic:  Alert and oriented x3;  grossly normal neurologically. Skin:  Intact without significant lesions or rashes. Cervical Nodes:  No significant cervical adenopathy. Psych:  Alert and cooperative. Normal affect.  LAB RESULTS:  Recent Labs  04/17/16 1618 04/18/16 0248  WBC 10.1 7.3  HGB 7.9* 8.4*  HCT 26.6* 27.6*  PLT 242 209   BMET  Recent Labs  04/17/16 1618 04/18/16 0248  NA 138 137  K 4.1 3.6  CL 107 108  CO2 21* 23  GLUCOSE 90 99  BUN 8 7  CREATININE 0.77 0.66  CALCIUM 9.1 8.5*   LFT No results for input(s): PROT, ALBUMIN, AST, ALT, ALKPHOS, BILITOT, BILIDIR, IBILI in the last 72 hours. PT/INR No results for input(s): LABPROT, INR in the last 72 hours.  STUDIES: Dg Chest 2 View  Result Date: 04/17/2016 CLINICAL DATA:  Anterior chest pain and upper back pain. EXAM: CHEST  2 VIEW COMPARISON:  None. FINDINGS: The heart size and mediastinal contours are within normal limits. Both lungs are clear.  The visualized skeletal structures are unremarkable. IMPRESSION: No active cardiopulmonary disease. Electronically Signed   By: Fidela Salisbury M.D.   On: 04/17/2016 17:07   Ct Angio Chest Pe W And/or Wo Contrast  Result Date: 04/17/2016 CLINICAL DATA:  Chest pain, mid back pain EXAM: CT ANGIOGRAPHY CHEST WITH CONTRAST TECHNIQUE: Multidetector CT imaging of the chest was performed using the standard protocol during bolus administration of intravenous contrast. Multiplanar CT image reconstructions and MIPs were obtained to evaluate the vascular anatomy. CONTRAST:  75 mL Isovue 370 COMPARISON:  None. FINDINGS: Mediastinum/Lymph Nodes: No pulmonary emboli or thoracic aortic dissection identified. No masses or pathologically enlarged lymph nodes identified. 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 Lungs/Pleura: No pulmonary mass, infiltrate, or effusion. Upper abdomen: No acute findings. Musculoskeletal: No chest wall mass or suspicious bone lesions identified. Dextrocurvature of the thoracic spine. Review of the MIP images confirms the above findings. IMPRESSION: 1. No pulmonary embolus. 2. No thoracic aortic dissection. Electronically Signed   By: Kathreen Devoid   On: 04/17/2016 20:59   Nm Myocar Multi W/spect W/wall Motion / Ef  Result Date: 04/18/2016  There was no ST segment deviation noted during stress.  No T wave inversion was noted during stress.  The study is normal.  This is a low risk study.  The left ventricular ejection fraction is normal (55-65%).       Impression / Plan:   Dillon Castillo is a 55 y.o. y/o male with symptomatic anemia.  The patient came in with chest pain and reports that his chest pain has resolved after his transfusion. Patient [name] 8 a full dinner and getting him prep for colonoscopy will likely be fruitless.  The patient will be set up for an upper endoscopy for tomorrow.  Does not show the cause of his anemia especially since he reported  melanotic stools last week, then he will be set up for colonoscopy to follow the next day.  The patient has been explained the plan and agrees with it.  Thank you for involving me in the care of this patient.      LOS: 1 day   Dillon Lame, MD  04/18/2016, 7:47 PM   Note: This dictation was prepared with Dragon dictation along with smaller phrase technology. Any transcriptional errors that result from this process are unintentional.

## 2016-04-18 NOTE — Progress Notes (Signed)
[name] at [facility]                                                                                                                                                                                            Patient Demographics   Dillon Castillo, is a 55 y.o. male, DOB - 1961/02/26, [id number]  Admit date - 04/17/2016   Admitting Physician Lance Coon, MD  Outpatient Primary MD for the patient is No PCP Per Patient   LOS - 1  Subjective: No further chest pains patient had a stress test earlier Patient has iron deficiency anemia needs further workup    Review of Systems:   CONSTITUTIONAL: No documented fever. No fatigue, weakness. No weight gain, no weight loss.  EYES: No blurry or double vision.  ENT: No tinnitus. No postnasal drip. No redness of the oropharynx.  RESPIRATORY: No cough, no wheeze, no hemoptysis. No dyspnea.  CARDIOVASCULAR: No chest pain. No orthopnea. No palpitations. No syncope.  GASTROINTESTINAL: No nausea, no vomiting or diarrhea. No abdominal pain. No melena or hematochezia.  GENITOURINARY: No dysuria or hematuria.  ENDOCRINE: No polyuria or nocturia. No heat or cold intolerance.  HEMATOLOGY: No anemia. No bruising. No bleeding.  INTEGUMENTARY: No rashes. No lesions.  MUSCULOSKELETAL: No arthritis. No swelling. No gout.  NEUROLOGIC: No numbness, tingling, or ataxia. No seizure-type activity.  PSYCHIATRIC: No anxiety. No insomnia. No ADD.    Vitals:   Vitals:   04/18/16 0042 04/18/16 0406 04/18/16 0821 04/18/16 1114  BP: (!) 184/80 (!) 160/82 (!) 165/93 (!) 157/87  Pulse: 95 93 (!) 105 85  Resp:  16  18  Temp: 98.5 F (36.9 C) 97.8 F (36.6 C) 98 F (36.7 C) 98 F (36.7 C)  TempSrc: Oral Oral Oral   SpO2: 95% 100% 94% 96%  Weight: 67.7 kg (149 lb 4.8 oz)     Height: 5\' 9"  (1.753 m)       Wt Readings from Last 3 Encounters:  04/18/16 67.7 kg (149 lb 4.8 oz)     Intake/Output Summary (Last 24 hours)  at 04/18/16 1412 Last data filed at 04/18/16 1325  Gross per 24 hour  Intake              265 ml  Output              800 ml  Net             -535 ml    Physical Exam:   GENERAL: Pleasant-appearing in no apparent distress.  HEAD, EYES, EARS, NOSE AND THROAT: Atraumatic, normocephalic. Extraocular muscles are intact.  Pupils equal and reactive to light. Sclerae anicteric. No conjunctival injection. No oro-pharyngeal erythema.  NECK: Supple. There is no jugular venous distention. No bruits, no lymphadenopathy, no thyromegaly.  HEART: Regular rate and rhythm,. No murmurs, no rubs, no clicks.  LUNGS: Clear to auscultation bilaterally. No rales or rhonchi. No wheezes.  ABDOMEN: Soft, flat, nontender, nondistended. Has good bowel sounds. No hepatosplenomegaly appreciated.  EXTREMITIES: No evidence of any cyanosis, clubbing, or peripheral edema.  +2 pedal and radial pulses bilaterally.  NEUROLOGIC: The patient is alert, awake, and oriented x3 with no focal motor or sensory deficits appreciated bilaterally.  SKIN: Moist and warm with no rashes appreciated.  Psych: Not anxious, depressed LN: No inguinal LN enlargement    Antibiotics   Anti-infectives    None      Medications   Scheduled Meds: . pantoprazole  40 mg Oral BID  . sodium chloride flush  3 mL Intravenous Q12H   Continuous Infusions:  PRN Meds:.acetaminophen **OR** acetaminophen, morphine injection, ondansetron **OR** ondansetron (ZOFRAN) IV, oxyCODONE   Data Review:   Micro Results No results found for this or any previous visit (from the past 240 hour(s)).  Radiology Reports Dg Chest 2 View  Result Date: 04/17/2016 CLINICAL DATA:  Anterior chest pain and upper back pain. EXAM: CHEST  2 VIEW COMPARISON:  None. FINDINGS: The heart size and mediastinal contours are within normal limits. Both lungs are clear. The visualized skeletal structures are unremarkable. IMPRESSION: No active cardiopulmonary disease.  Electronically Signed   By: Fidela Salisbury M.D.   On: 04/17/2016 17:07   Ct Angio Chest Pe W And/or Wo Contrast  Result Date: 04/17/2016 CLINICAL DATA:  Chest pain, mid back pain EXAM: CT ANGIOGRAPHY CHEST WITH CONTRAST TECHNIQUE: Multidetector CT imaging of the chest was performed using the standard protocol during bolus administration of intravenous contrast. Multiplanar CT image reconstructions and MIPs were obtained to evaluate the vascular anatomy. CONTRAST:  75 mL Isovue 370 COMPARISON:  None. FINDINGS: Mediastinum/Lymph Nodes: No pulmonary emboli or thoracic aortic dissection identified. No masses or pathologically enlarged lymph nodes identified. 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 Lungs/Pleura: No pulmonary mass, infiltrate, or effusion. Upper abdomen: No acute findings. Musculoskeletal: No chest wall mass or suspicious bone lesions identified. Dextrocurvature of the thoracic spine. Review of the MIP images confirms the above findings. IMPRESSION: 1. No pulmonary embolus. 2. No thoracic aortic dissection. Electronically Signed   By: Kathreen Devoid   On: 04/17/2016 20:59     CBC  Recent Labs Lab 04/17/16 1618 04/18/16 0248  WBC 10.1 7.3  HGB 7.9* 8.4*  HCT 26.6* 27.6*  PLT 242 209  MCV 67.0* 68.3*  MCH 19.9* 20.9*  MCHC 29.7* 30.6*  RDW 20.2* 21.5*    Chemistries   Recent Labs Lab 04/17/16 1618 04/18/16 0248  NA 138 137  K 4.1 3.6  CL 107 108  CO2 21* 23  GLUCOSE 90 99  BUN 8 7  CREATININE 0.77 0.66  CALCIUM 9.1 8.5*   ------------------------------------------------------------------------------------------------------------------ estimated creatinine clearance is 99.9 mL/min (by C-G formula based on SCr of 0.8 mg/dL). ------------------------------------------------------------------------------------------------------------------ No results for input(s): HGBA1C in the last 72  hours. ------------------------------------------------------------------------------------------------------------------ No results for input(s): CHOL, HDL, LDLCALC, TRIG, CHOLHDL, LDLDIRECT in the last 72 hours. ------------------------------------------------------------------------------------------------------------------ No results for input(s): TSH, T4TOTAL, T3FREE, THYROIDAB in the last 72 hours.  Invalid input(s): FREET3 ------------------------------------------------------------------------------------------------------------------  Recent Labs  04/18/16 0248  FERRITIN 4*  TIBC 465*  IRON 60    Coagulation profile No  results for input(s): INR, PROTIME in the last 168 hours.  No results for input(s): DDIMER in the last 72 hours.  Cardiac Enzymes  Recent Labs Lab 04/17/16 1618 04/17/16 2013 04/18/16 0248  TROPONINI <0.03 <0.03 <0.03   ------------------------------------------------------------------------------------------------------------------ Invalid input(s): POCBNP    Assessment & Plan   Patient is a 55 year old male presenting with chest pain noted to be anemic  1. Chest pain status post stress test await stress test results likely due to symptomatic anemia  2. Iron deficiency anemia will likely need endoscopy and colonoscopy GI consult pending  3. Hypertension I will start patient on HCTZ     Code Status Orders        Start     Ordered   04/18/16 0034  Full code  Continuous     04/18/16 0033    Code Status History    Date Active Date Inactive Code Status Order ID Comments User Context   04/18/2016 12:33 AM 04/18/2016 10:16 AM Full Code [id number]  Lance Coon, MD Inpatient           Consults Cardiology and GI  DVT Prophylaxis scd's  Lab Results  Component Value Date   PLT 209 04/18/2016     Time Spent in minutes  61min Greater than 50% of time spent in care coordination and counseling patient regarding the condition and  plan of care.   Dustin Flock M.D on 04/18/2016 at 2:12 PM  Between 7am to 6pm - Pager - [phone]  After 6pm go to [email or web address] - password [email or web address] [facility]   Office  [phone]

## 2016-04-19 ENCOUNTER — Encounter
Admission: EM | Disposition: A | Discharge status: Home / Self Care | Payer: Self-pay | Source: Home / Self Care | Attending: Internal Medicine

## 2016-04-19 ENCOUNTER — Inpatient Hospital Stay: Payer: BLUE CROSS/BLUE SHIELD | Admitting: Certified Registered"

## 2016-04-19 ENCOUNTER — Encounter: Payer: Self-pay | Admitting: *Deleted

## 2016-04-19 DIAGNOSIS — K3189 Other diseases of stomach and duodenum: Secondary | ICD-10-CM

## 2016-04-19 DIAGNOSIS — D509 Iron deficiency anemia, unspecified: Secondary | ICD-10-CM

## 2016-04-19 HISTORY — PX: ESOPHAGOGASTRODUODENOSCOPY (EGD) WITH PROPOFOL: SHX5813

## 2016-04-19 LAB — BASIC METABOLIC PANEL
ANION GAP: 8 (ref 5–15)
BUN: 9 mg/dL (ref 6–20)
CALCIUM: 9.1 mg/dL (ref 8.9–10.3)
CO2: 26 mmol/L (ref 22–32)
Chloride: 105 mmol/L (ref 101–111)
Creatinine, Ser: 0.61 mg/dL (ref 0.61–1.24)
GLUCOSE: 95 mg/dL (ref 65–99)
POTASSIUM: 3.8 mmol/L (ref 3.5–5.1)
Sodium: 139 mmol/L (ref 135–145)

## 2016-04-19 LAB — TYPE AND SCREEN
ABO/RH(D): A POS
Antibody Screen: NEGATIVE
UNIT DIVISION: 0
Unit division: 0

## 2016-04-19 LAB — CBC
HCT: 30.7 % — ABNORMAL LOW (ref 40.0–52.0)
Hemoglobin: 9.3 g/dL — ABNORMAL LOW (ref 13.0–18.0)
MCH: 20.9 pg — ABNORMAL LOW (ref 26.0–34.0)
MCHC: 30.4 g/dL — ABNORMAL LOW (ref 32.0–36.0)
MCV: 68.8 fL — AB (ref 80.0–100.0)
PLATELETS: 211 10*3/uL (ref 150–440)
RBC: 4.47 MIL/uL (ref 4.40–5.90)
RDW: 21.7 % — AB (ref 11.5–14.5)
WBC: 6.4 10*3/uL (ref 3.8–10.6)

## 2016-04-19 LAB — PREPARE RBC (CROSSMATCH)

## 2016-04-19 SURGERY — ESOPHAGOGASTRODUODENOSCOPY (EGD) WITH PROPOFOL
Anesthesia: General

## 2016-04-19 MED ORDER — PROPOFOL 500 MG/50ML IV EMUL
INTRAVENOUS | Status: DC | PRN
  Administered 2016-04-19: 150 ug/kg/min via INTRAVENOUS

## 2016-04-19 MED ORDER — ATENOLOL 25 MG PO TABS
25.0000 mg | ORAL_TABLET | Freq: Every day | ORAL | Status: DC
  Administered 2016-04-19: 25 mg via ORAL
  Filled 2016-04-19: qty 1

## 2016-04-19 MED ORDER — MIDAZOLAM HCL 2 MG/2ML IJ SOLN
INTRAMUSCULAR | Status: DC | PRN
  Administered 2016-04-19: 2 mg via INTRAVENOUS

## 2016-04-19 MED ORDER — PROPOFOL 10 MG/ML IV BOLUS
INTRAVENOUS | Status: DC | PRN
  Administered 2016-04-19: 80 mg via INTRAVENOUS

## 2016-04-19 MED ORDER — ATENOLOL 25 MG PO TABS: 25.0000 mg | ORAL_TABLET | Freq: Every day | ORAL | 0 refills | Status: DC

## 2016-04-19 MED ORDER — IRON (FERROUS SULFATE) 142 (45 FE) MG PO TBCR: 142.0000 mg | EXTENDED_RELEASE_TABLET | Freq: Two times a day (BID) | ORAL | 0 refills | Status: DC

## 2016-04-19 MED ORDER — PANTOPRAZOLE SODIUM 40 MG PO TBEC: 40.0000 mg | DELAYED_RELEASE_TABLET | Freq: Every day | ORAL | 0 refills | Status: DC

## 2016-04-19 MED ORDER — SODIUM CHLORIDE 0.9 % IV SOLN
INTRAVENOUS | Status: DC
  Administered 2016-04-19: 09:00:00 via INTRAVENOUS

## 2016-04-19 NOTE — Anesthesia Preprocedure Evaluation (Signed)
Anesthesia Evaluation  Patient identified by MRN, date of birth, ID band Patient awake    Reviewed: Allergy & Precautions, H&P , NPO status , Patient's Chart, lab work & pertinent test results, reviewed documented beta blocker date and time   Airway Mallampati: II   Neck ROM: full    Dental  (+) Poor Dentition   Pulmonary neg pulmonary ROS, Current Smoker,    Pulmonary exam normal        Cardiovascular hypertension, negative cardio ROS Normal cardiovascular exam Rhythm:regular Rate:Normal     Neuro/Psych negative neurological ROS  negative psych ROS   GI/Hepatic negative GI ROS, Neg liver ROS,   Endo/Other  negative endocrine ROS  Renal/GU negative Renal ROS  negative genitourinary   Musculoskeletal   Abdominal   Peds  Hematology negative hematology ROS (+) anemia ,   Anesthesia Other Findings Past Medical History: No date: Hypertension No date: Polysubstance abuse     Comment: a. ongoing tobacco and etoh abuse; b. remote               marijuana abuse in his 20's Past Surgical History: No date: APPENDECTOMY BMI    Body Mass Index:  22.00 kg/m     Reproductive/Obstetrics                             Anesthesia Physical Anesthesia Plan  ASA: III and emergent  Anesthesia Plan: General   Post-op Pain Management:    Induction:   Airway Management Planned:   Additional Equipment:   Intra-op Plan:   Post-operative Plan:   Informed Consent: I have reviewed the patients History and Physical, chart, labs and discussed the procedure including the risks, benefits and alternatives for the proposed anesthesia with the patient or authorized representative who has indicated his/her understanding and acceptance.   Dental Advisory Given  Plan Discussed with: CRNA  Anesthesia Plan Comments:         Anesthesia Quick Evaluation

## 2016-04-19 NOTE — Transfer of Care (Signed)
Immediate Anesthesia Transfer of Care Note  Patient: Dillon Castillo  Procedure(s) Performed: Procedure(s): ESOPHAGOGASTRODUODENOSCOPY (EGD) WITH PROPOFOL (N/A)  Patient Location: PACU  Anesthesia Type:General  Level of Consciousness: sedated and responds to stimulation  Airway & Oxygen Therapy: Patient Spontanous Breathing and Patient connected to nasal cannula oxygen  Post-op Assessment: Report given to RN and Post -op Vital signs reviewed and stable  Post vital signs: Reviewed and stable  Last Vitals:  Vitals:   04/19/16 0902 04/19/16 0928  BP: (!) 160/85 114/70  Pulse: 86 (!) 114  Resp: 16 (!) 30  Temp: 37.6 C     Last Pain:  Vitals:   04/19/16 0902  TempSrc: Tympanic  PainSc:          Complications: No apparent anesthesia complications

## 2016-04-19 NOTE — Progress Notes (Signed)
Dillon Castillo at [facility] was admitted to the Hospital on 04/17/2016 and Discharged  04/19/2016 and should be excused from work/school   for 8 days starting 04/17/2016 , may return to work/school without any restrictions.  Call Dustin Flock MD with questions.  Dustin Flock M.D on 04/19/2016,at 12:43 PM  [place] [facility]  [phone]

## 2016-04-19 NOTE — Progress Notes (Signed)
Discharge instructions explained to pt / verbalized an understanding/ iv and tele removed/ RX given to pt/ tolerating meal/ will transport off unit via wheelchair when ride arrives

## 2016-04-19 NOTE — Anesthesia Postprocedure Evaluation (Signed)
Anesthesia Post Note  Patient: Dillon Castillo  Procedure(s) Performed: Procedure(s) (LRB): ESOPHAGOGASTRODUODENOSCOPY (EGD) WITH PROPOFOL (N/A)  Patient location during evaluation: PACU Anesthesia Type: General Level of consciousness: awake and alert Pain management: pain level controlled Vital Signs Assessment: post-procedure vital signs reviewed and stable Respiratory status: spontaneous breathing, nonlabored ventilation, respiratory function stable and patient connected to nasal cannula oxygen Cardiovascular status: blood pressure returned to baseline and stable Postop Assessment: no signs of nausea or vomiting Anesthetic complications: no    Last Vitals:  Vitals:   04/19/16 0930 04/19/16 0940  BP: 130/87 130/87  Pulse: (!) 110 (!) 106  Resp: (!) 24 (!) 26  Temp:      Last Pain:  Vitals:   04/19/16 0928  TempSrc: Tympanic  PainSc:                  Molli Barrows

## 2016-04-19 NOTE — Discharge Summary (Signed)
Dillon Castillo, 55 y.o., DOB 02-15-1961, MRN [id number]. Admission date: 04/17/2016 Discharge Date 04/19/2016 Primary MD No PCP Per Patient Admitting Physician Lance Coon, MD  Admission Diagnosis  Chest pain, unspecified chest pain type [R07.9] Anemia, unspecified anemia type [D64.9]  Discharge Diagnosis   Principal Problem:   Chest pain due to symptomatic anemia   Iron deficiency anemia   HTN (hypertension)   Melena that is post EGD will need outpatient colonoscopy   Iron deficiency anemia, unspecified   Essential hypertension           Hospital Course Dillon Castillo  is a 55 y.o. male who presents with An episode of central chest discomfort which radiated through to his back. This started while he was driving to work today, but he noticed that it was exacerbated with any exertion and relieved some with rest. He came to the ED for evaluation was found to be significantly anemic, with a hemoglobin of 7.9. Patient was admitted for further workup. Cardiac enzymes remain negative he was seen by cardiology and underwent a stress test which was negative. In terms of his anemia showed iron deficiency anemia and was seen by GI and had a EGD which showed no obvious source of bleeding. Patient will have outpatient colonoscopy. Currently patient is asymptomatic. He'll need outpatient follow-up with GI for colonoscopy. I will also refer him to hematology to keep eye on his hemoglobin.             Consults  cardiology, gi  Significant Tests:  See full reports for all details     Dg Chest 2 View  Result Date: 04/17/2016 CLINICAL DATA:  Anterior chest pain and upper back pain. EXAM: CHEST  2 VIEW COMPARISON:  None. FINDINGS: The heart size and mediastinal contours are within normal limits. Both lungs are clear. The visualized skeletal structures are unremarkable. IMPRESSION: No active cardiopulmonary disease. Electronically Signed   By: Fidela Salisbury M.D.   On: 04/17/2016 17:07    Ct Angio Chest Pe W And/or Wo Contrast  Result Date: 04/17/2016 CLINICAL DATA:  Chest pain, mid back pain EXAM: CT ANGIOGRAPHY CHEST WITH CONTRAST TECHNIQUE: Multidetector CT imaging of the chest was performed using the standard protocol during bolus administration of intravenous contrast. Multiplanar CT image reconstructions and MIPs were obtained to evaluate the vascular anatomy. CONTRAST:  75 mL Isovue 370 COMPARISON:  None. FINDINGS: Mediastinum/Lymph Nodes: No pulmonary emboli or thoracic aortic dissection identified. No masses or pathologically enlarged lymph nodes identified. 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 Lungs/Pleura: No pulmonary mass, infiltrate, or effusion. Upper abdomen: No acute findings. Musculoskeletal: No chest wall mass or suspicious bone lesions identified. Dextrocurvature of the thoracic spine. Review of the MIP images confirms the above findings. IMPRESSION: 1. No pulmonary embolus. 2. No thoracic aortic dissection. Electronically Signed   By: Kathreen Devoid   On: 04/17/2016 20:59   Nm Myocar Multi W/spect W/wall Motion / Ef  Result Date: 04/18/2016  There was no ST segment deviation noted during stress.  No T wave inversion was noted during stress.  The study is normal.  This is a low risk study.  The left ventricular ejection fraction is normal (55-65%).        Today   Subjective:   Dillon Castillo  feels well denies any complaints no chest pain or shortness of breath  Objective:   Blood pressure (!) 145/78, pulse 95, temperature 98.2 F (36.8 C), temperature source Oral, resp. rate 15, height 5\' 9"  (  1.753 m), weight 67.6 kg (149 lb), SpO2 97 %.  .  Intake/Output Summary (Last 24 hours) at 04/19/16 1307 Last data filed at 04/19/16 0925  Gross per 24 hour  Intake              680 ml  Output                0 ml  Net              680 ml    Exam VITAL SIGNS: Blood pressure (!) 145/78, pulse 95, temperature 98.2 F (36.8 C),  temperature source Oral, resp. rate 15, height 5\' 9"  (1.753 m), weight 67.6 kg (149 lb), SpO2 97 %.  GENERAL:  55 y.o.-year-old patient lying in the bed with no acute distress.  EYES: Pupils equal, round, reactive to light and accommodation. No scleral icterus. Extraocular muscles intact.  HEENT: Head atraumatic, normocephalic. Oropharynx and nasopharynx clear.  NECK:  Supple, no jugular venous distention. No thyroid enlargement, no tenderness.  LUNGS: Normal breath sounds bilaterally, no wheezing, rales,rhonchi or crepitation. No use of accessory muscles of respiration.  CARDIOVASCULAR: S1, S2 normal. No murmurs, rubs, or gallops.  ABDOMEN: Soft, nontender, nondistended. Bowel sounds present. No organomegaly or mass.  EXTREMITIES: No pedal edema, cyanosis, or clubbing.  NEUROLOGIC: Cranial nerves II through XII are intact. Muscle strength 5/5 in all extremities. Sensation intact. Gait not checked.  PSYCHIATRIC: The patient is alert and oriented x 3.  SKIN: No obvious rash, lesion, or ulcer.   Data Review     CBC w Diff: Lab Results  Component Value Date   WBC 6.4 04/19/2016   HGB 9.3 (L) 04/19/2016   HCT 30.7 (L) 04/19/2016   PLT 211 04/19/2016   CMP: Lab Results  Component Value Date   NA 139 04/19/2016   K 3.8 04/19/2016   CL 105 04/19/2016   CO2 26 04/19/2016   BUN 9 04/19/2016   CREATININE 0.61 04/19/2016  .  Micro Results No results found for this or any previous visit (from the past 240 hour(s)).      Code Status Orders        Start     Ordered   04/18/16 0034  Full code  Continuous     04/18/16 0033    Code Status History    Date Active Date Inactive Code Status Order ID Comments User Context   04/18/2016 12:33 AM 04/18/2016 10:16 AM Full Code [id number]  Lance Coon, MD Inpatient          Follow-up Information    [name], MD In 1 week.   Specialty:  Gastroenterology Why:  need for colonoscopy, call to schedule so they can go over  instructions Contact information: [address] [address] [phone]        Lloyd Huger, MD On 05/05/2016.   Specialty:  Oncology Why:  @915am  Contact information: [place] [address] [phone]           Discharge Medications     Medication List    TAKE these medications   atenolol 25 MG tablet Commonly known as:  TENORMIN Take 1 tablet (25 mg total) by mouth daily.   Iron (Ferrous Sulfate) 142 (45 Fe) MG Tbcr Take 142 mg by mouth 2 (two) times daily.   pantoprazole 40 MG tablet Commonly known as:  PROTONIX Take 1 tablet (40 mg total) by mouth daily.  Total Time in preparing paper work, data evaluation and todays exam - 35 minutes  Dustin Flock M.D on 04/19/2016 at 1:07 [facility]  [facility]   Office  [phone]

## 2016-04-19 NOTE — Progress Notes (Signed)
Patient has no acute event overnight. He remained hemodynamically stable with VS WDL for patient. Patient is kept NPO overnight for scheduled EGD in AM. Patient was educated about the procedure, handout was provided and consent was signed by patient.

## 2016-04-19 NOTE — Discharge Instructions (Signed)

## 2016-04-19 NOTE — Op Note (Signed)
[facility] Patient Name: Dillon Castillo Procedure Date: 04/19/2016 9:12 AM MRN: [id number] Account #: [id number] Date of Birth: 05/21/1961 Admit Type: Inpatient Age: 55 Room: [facility] ENDO ROOM 4 Gender: Male Note Status: Finalized Procedure:            Upper GI endoscopy Indications:          Iron deficiency anemia Providers:            [name] MD, MD Referring MD:         No Local Md, MD (Referring MD) Medicines:            Propofol per Anesthesia Complications:        No immediate complications. Procedure:            Pre-Anesthesia Assessment:                       - Prior to the procedure, a History and Physical was                        performed, and patient medications and allergies were                        reviewed. The patient's tolerance of previous                        anesthesia was also reviewed. The risks and benefits of                        the procedure and the sedation options and risks were                        discussed with the patient. All questions were                        answered, and informed consent was obtained. Prior                        Anticoagulants: The patient has taken no previous                        anticoagulant or antiplatelet agents. ASA Grade                        Assessment: II - A patient with mild systemic disease.                        After reviewing the risks and benefits, the patient was                        deemed in satisfactory condition to undergo the                        procedure.                       After obtaining informed consent, the endoscope was                        passed under direct vision. Throughout the procedure,  the patient's blood pressure, pulse, and oxygen                        saturations were monitored continuously. The Endoscope                        was introduced through the mouth, and advanced to the    second part of duodenum. The upper GI endoscopy was                        accomplished without difficulty. The patient tolerated                        the procedure well. Findings:      The examined esophagus was normal.      Diffuse nodular mucosa was found in the entire examined stomach.       Biopsies were taken with a cold forceps for histology.      The examined duodenum was normal. Impression:           - Normal esophagus.                       - Nodular mucosa in the entire stomach. Biopsied.                       - Normal examined duodenum. Recommendation:       - Await pathology results.                       - Advance diet as tolerated.                       - Continue present medications. Procedure Code(s):    --- Professional ---                       [phone], Esophagogastroduodenoscopy, flexible, transoral;                        with biopsy, single or multiple Diagnosis Code(s):    --- Professional ---                       D50.9, Iron deficiency anemia, unspecified                       K31.89, Other diseases of stomach and duodenum CPT copyright 2016 American Medical Association. All rights reserved. The codes documented in this report are preliminary and upon coder review may  be revised to meet current compliance requirements. [name] MD, MD 04/19/2016 9:24:14 AM This report has been signed electronically. Number of Addenda: 0 Note Initiated On: 04/19/2016 9:12 AM      [facility]

## 2016-04-20 ENCOUNTER — Encounter: Payer: Self-pay | Admitting: Gastroenterology

## 2016-04-21 ENCOUNTER — Other Ambulatory Visit: Payer: Self-pay

## 2016-04-21 ENCOUNTER — Telehealth: Payer: Self-pay | Admitting: Gastroenterology

## 2016-04-21 ENCOUNTER — Encounter: Payer: Self-pay | Admitting: Gastroenterology

## 2016-04-21 DIAGNOSIS — A048 Other specified bacterial intestinal infections: Secondary | ICD-10-CM

## 2016-04-21 LAB — SURGICAL PATHOLOGY

## 2016-04-21 MED ORDER — AMOXICILLIN 500 MG PO CAPS: 1000.0000 mg | ORAL_CAPSULE | Freq: Two times a day (BID) | ORAL | 0 refills | Status: DC

## 2016-04-21 MED ORDER — CLARITHROMYCIN 500 MG PO TABS: 500.0000 mg | ORAL_TABLET | Freq: Two times a day (BID) | ORAL | 0 refills | Status: DC

## 2016-04-21 NOTE — Telephone Encounter (Signed)
Patient called to schedule a colonoscopy . °

## 2016-04-22 ENCOUNTER — Telehealth: Payer: Self-pay

## 2016-04-22 ENCOUNTER — Other Ambulatory Visit: Payer: Self-pay

## 2016-04-22 NOTE — Telephone Encounter (Signed)
Screening Colonoscopy Z12.11 [facility] [id number] Please pre cert

## 2016-04-22 NOTE — Telephone Encounter (Signed)
Gastroenterology Pre-Procedure Review  Request Date: 06/21/2016 Requesting Physician:   PATIENT REVIEW QUESTIONS: The patient responded to the following health history questions as indicated:    1. Are you having any GI issues? no 2. Do you have a personal history of Polyps? no 3. Do you have a family history of Colon Cancer or Polyps? yes (brother polyps benign) 4. Diabetes Mellitus? no 5. Joint replacements in the past 12 months?no 6. Major health problems in the past 3 months?no 7. Any artificial heart valves, MVP, or defibrillator?no    MEDICATIONS & ALLERGIES:    Patient reports the following regarding taking any anticoagulation/antiplatelet therapy:   Plavix, Coumadin, Eliquis, Xarelto, Lovenox, Pradaxa, Brilinta, or Effient? no Aspirin? no  Patient confirms/reports the following medications:  Current Outpatient Prescriptions  Medication Sig Dispense Refill  . amoxicillin (AMOXIL) 500 MG capsule Take 2 capsules (1,000 mg total) by mouth 2 (two) times daily. 56 capsule 0  . atenolol (TENORMIN) 25 MG tablet Take 1 tablet (25 mg total) by mouth daily. 30 tablet 0  . clarithromycin (BIAXIN) 500 MG tablet Take 1 tablet (500 mg total) by mouth 2 (two) times daily. 28 tablet 0  . Iron, Ferrous Sulfate, 142 (45 Fe) MG TBCR Take 142 mg by mouth 2 (two) times daily. 60 tablet 0  . pantoprazole (PROTONIX) 40 MG tablet Take 1 tablet (40 mg total) by mouth daily. 30 tablet 0   No current facility-administered medications for this visit.     Patient confirms/reports the following allergies:  No Known Allergies  No orders of the defined types were placed in this encounter.   AUTHORIZATION INFORMATION Primary Insurance: 1D#: Group #:  Secondary Insurance: 1D#: Group #:  SCHEDULE INFORMATION: Date: 06/21/2016 Time: Location: [place]

## 2016-04-26 NOTE — Telephone Encounter (Signed)
Patient has [employer], he does not need prior authorization for this procedure

## 2016-04-26 NOTE — Telephone Encounter (Signed)
Patient scheduled for [facility] 06/21/2016

## 2016-05-04 NOTE — Progress Notes (Signed)
[name]  Telephone:(336) [phone] Fax:(336) [phone]  ID: Dillon Castillo OB: 03/19/1961  MR#: [id number]  [id number]  Patient Care Team: No Pcp Per Patient as PCP - General (General Practice)  CHIEF COMPLAINT:  Iron deficiency anemia.  INTERVAL HISTORY: Patient is a 55 year old male who was recently admitted the hospital and diagnosed with chest pain secondary to symptomatic anemia. He also reported melena at that time. EGD did not reveal a source of bleeding and patient is scheduled to have an outpatient colonoscopy on June 21, 2016. He received one unit of packed red blood cells in the hospital. Currently, patient feels well and back to his baseline. He has no neurologic complaints. He denies any recent fevers. He has a good appetite and denies weight loss. He has no further chest pain and denies any shortness of breath or cough. He denies any nausea, vomiting, constipation, or diarrhea. He denies any further melena. He has no urinary complaints. Patient offers no further specific complaints.  REVIEW OF SYSTEMS:   Review of Systems  Constitutional: Negative.  Negative for fever and weight loss.  Respiratory: Negative.  Negative for shortness of breath.   Cardiovascular: Negative.  Negative for chest pain and leg swelling.  Gastrointestinal: Positive for melena. Negative for abdominal pain, blood in stool, constipation, diarrhea, nausea and vomiting.  Genitourinary: Negative.   Neurological: Negative.  Negative for weakness.  Psychiatric/Behavioral: Negative.  The patient is not nervous/anxious.     As per HPI. Otherwise, a complete review of systems is negative.  PAST MEDICAL HISTORY: Past Medical History:  Diagnosis Date  . Gout   . Heart murmur   . Hypertension   . Polysubstance abuse    a. ongoing tobacco and etoh abuse; b. remote marijuana abuse in his 20's    PAST SURGICAL HISTORY: Past Surgical History:  Procedure Laterality Date  .  APPENDECTOMY    . ESOPHAGOGASTRODUODENOSCOPY (EGD) WITH PROPOFOL N/A 04/19/2016   Procedure: ESOPHAGOGASTRODUODENOSCOPY (EGD) WITH PROPOFOL;  Surgeon: [name], MD;  Location: [facility] ENDOSCOPY;  Service: Endoscopy;  Laterality: N/A;    FAMILY HISTORY: Family History  Problem Relation Age of Onset  . Hypertension Mother   . Heart disease Father   . Heart attack Brother     ADVANCED DIRECTIVES (Y/N):  N  HEALTH MAINTENANCE: Social History  Substance Use Topics  . Smoking status: Current Every Day Smoker    Packs/day: 0.50    Years: 20.00    Types: Cigarettes  . Smokeless tobacco: Never Used  . Alcohol use Yes     Comment: beer     Colonoscopy:  PAP:  Bone density:  Lipid panel:  No Known Allergies  Current Outpatient Prescriptions  Medication Sig Dispense Refill  . amoxicillin (AMOXIL) 500 MG capsule Take 2 capsules (1,000 mg total) by mouth 2 (two) times daily. 56 capsule 0  . atenolol (TENORMIN) 25 MG tablet Take 1 tablet (25 mg total) by mouth daily. 30 tablet 0  . clarithromycin (BIAXIN) 500 MG tablet Take 1 tablet (500 mg total) by mouth 2 (two) times daily. 28 tablet 0  . Iron, Ferrous Sulfate, 142 (45 Fe) MG TBCR Take 142 mg by mouth 2 (two) times daily. 60 tablet 0  . pantoprazole (PROTONIX) 40 MG tablet Take 1 tablet (40 mg total) by mouth daily. 30 tablet 0   No current facility-administered medications for this visit.     OBJECTIVE: Vitals:   05/05/16 0911  BP: (!) 180/107  Pulse: 98  Resp:  18  Temp: 97.1 F (36.2 C)     Body mass index is 21.13 kg/m.    ECOG FS:0 - Asymptomatic  General: Well-developed, well-nourished, no acute distress. Eyes: Pink conjunctiva, anicteric sclera. HEENT: Normocephalic, moist mucous membranes, clear oropharnyx. Lungs: Clear to auscultation bilaterally. Heart: Regular rate and rhythm. No rubs, murmurs, or gallops. Abdomen: Soft, nontender, nondistended. No organomegaly noted, normoactive bowel  sounds. Musculoskeletal: No edema, cyanosis, or clubbing. Neuro: Alert, answering all questions appropriately. Cranial nerves grossly intact. Skin: No rashes or petechiae noted. Psych: Normal affect. Lymphatics: No cervical, calvicular, axillary or inguinal LAD.   LAB RESULTS:  Lab Results  Component Value Date   NA 139 04/19/2016   K 3.8 04/19/2016   CL 105 04/19/2016   CO2 26 04/19/2016   GLUCOSE 95 04/19/2016   BUN 9 04/19/2016   CREATININE 0.61 04/19/2016   CALCIUM 9.1 04/19/2016   GFRNONAA >60 04/19/2016   GFRAA >60 04/19/2016    Lab Results  Component Value Date   WBC 5.1 05/05/2016   NEUTROABS 2.7 05/05/2016   HGB 11.9 (L) 05/05/2016   HCT 38.5 (L) 05/05/2016   MCV 76.0 (L) 05/05/2016   PLT 190 05/05/2016   Lab Results  Component Value Date   IRON 60 05/05/2016   TIBC 486 (H) 05/05/2016   IRONPCTSAT 12 (L) 05/05/2016   Lab Results  Component Value Date   FERRITIN 64 05/05/2016     STUDIES: Dg Chest 2 View  Result Date: 04/17/2016 CLINICAL DATA:  Anterior chest pain and upper back pain. EXAM: CHEST  2 VIEW COMPARISON:  None. FINDINGS: The heart size and mediastinal contours are within normal limits. Both lungs are clear. The visualized skeletal structures are unremarkable. IMPRESSION: No active cardiopulmonary disease. Electronically Signed   By: Fidela Salisbury M.D.   On: 04/17/2016 17:07   Ct Angio Chest Pe W And/or Wo Contrast  Result Date: 04/17/2016 CLINICAL DATA:  Chest pain, mid back pain EXAM: CT ANGIOGRAPHY CHEST WITH CONTRAST TECHNIQUE: Multidetector CT imaging of the chest was performed using the standard protocol during bolus administration of intravenous contrast. Multiplanar CT image reconstructions and MIPs were obtained to evaluate the vascular anatomy. CONTRAST:  75 mL Isovue 370 COMPARISON:  None. FINDINGS: Mediastinum/Lymph Nodes: No pulmonary emboli or thoracic aortic dissection identified. No masses or pathologically enlarged lymph  nodes identified. 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 Lungs/Pleura: No pulmonary mass, infiltrate, or effusion. Upper abdomen: No acute findings. Musculoskeletal: No chest wall mass or suspicious bone lesions identified. Dextrocurvature of the thoracic spine. Review of the MIP images confirms the above findings. IMPRESSION: 1. No pulmonary embolus. 2. No thoracic aortic dissection. Electronically Signed   By: Kathreen Devoid   On: 04/17/2016 20:59   Nm Myocar Multi W/spect W/wall Motion / Ef  Result Date: 04/18/2016  There was no ST segment deviation noted during stress.  No T wave inversion was noted during stress.  The study is normal.  This is a low risk study.  The left ventricular ejection fraction is normal (55-65%).     ASSESSMENT: Iron deficiency anemia.  PLAN:    1.  Iron deficiency anemia: Likely secondary to GI bleed. EGD results did not reveal definitive source. Patient has colonoscopy scheduled on June 21, 2016. His hemoglobin has significantly improved and is nearly within normal limits today after receiving 1 unit of packed red blood cells as an inpatient. His iron stores are still significantly reduced, therefore he will return to clinic in  1 and 2 weeks to receive 510 mg of IV Feraheme. Patient will return to clinic in the first 1 to 2 weeks of November after his colonoscopy for repeat laboratory work and discussion of his colonoscopy results. A CEA was drawn today for completeness.  Approximately 45 minutes was spent in discussion of which greater than 50% was consultation.  Patient expressed understanding and was in agreement with this plan. He also understands that He can call clinic at any time with any questions, concerns, or complaints.   No matching staging information was found for the patient.  Lloyd Huger, MD   05/05/2016 12:17 PM

## 2016-05-05 ENCOUNTER — Encounter: Payer: Self-pay | Admitting: Oncology

## 2016-05-05 ENCOUNTER — Inpatient Hospital Stay: Payer: BLUE CROSS/BLUE SHIELD | Attending: Oncology | Admitting: Oncology

## 2016-05-05 ENCOUNTER — Inpatient Hospital Stay: Payer: BLUE CROSS/BLUE SHIELD

## 2016-05-05 ENCOUNTER — Other Ambulatory Visit: Payer: Self-pay

## 2016-05-05 VITALS — BP 180/107 | HR 98 | Temp 97.1°F | Resp 18 | Wt 143.1 lb

## 2016-05-05 DIAGNOSIS — D509 Iron deficiency anemia, unspecified: Secondary | ICD-10-CM | POA: Diagnosis not present

## 2016-05-05 DIAGNOSIS — F1010 Alcohol abuse, uncomplicated: Secondary | ICD-10-CM | POA: Insufficient documentation

## 2016-05-05 DIAGNOSIS — Z79899 Other long term (current) drug therapy: Secondary | ICD-10-CM | POA: Insufficient documentation

## 2016-05-05 DIAGNOSIS — I10 Essential (primary) hypertension: Secondary | ICD-10-CM | POA: Insufficient documentation

## 2016-05-05 DIAGNOSIS — M109 Gout, unspecified: Secondary | ICD-10-CM | POA: Diagnosis not present

## 2016-05-05 DIAGNOSIS — F17210 Nicotine dependence, cigarettes, uncomplicated: Secondary | ICD-10-CM | POA: Diagnosis not present

## 2016-05-05 LAB — CBC WITH DIFFERENTIAL/PLATELET
BASOS ABS: 0 10*3/uL (ref 0–0.1)
Basophils Relative: 1 %
Eosinophils Absolute: 0.3 10*3/uL (ref 0–0.7)
Eosinophils Relative: 5 %
HEMATOCRIT: 38.5 % — AB (ref 40.0–52.0)
Hemoglobin: 11.9 g/dL — ABNORMAL LOW (ref 13.0–18.0)
LYMPHS PCT: 29 %
Lymphs Abs: 1.5 10*3/uL (ref 1.0–3.6)
MCH: 23.5 pg — AB (ref 26.0–34.0)
MCHC: 30.9 g/dL — AB (ref 32.0–36.0)
MCV: 76 fL — AB (ref 80.0–100.0)
MONO ABS: 0.6 10*3/uL (ref 0.2–1.0)
MONOS PCT: 12 %
NEUTROS ABS: 2.7 10*3/uL (ref 1.4–6.5)
Neutrophils Relative %: 53 %
Platelets: 190 10*3/uL (ref 150–440)
RBC: 5.07 MIL/uL (ref 4.40–5.90)
RDW: 32.7 % — AB (ref 11.5–14.5)
WBC: 5.1 10*3/uL (ref 3.8–10.6)

## 2016-05-05 LAB — IRON AND TIBC
Iron: 60 ug/dL (ref 45–182)
SATURATION RATIOS: 12 % — AB (ref 17.9–39.5)
TIBC: 486 ug/dL — AB (ref 250–450)
UIBC: 426 ug/dL

## 2016-05-05 LAB — FERRITIN: Ferritin: 64 ng/mL (ref 24–336)

## 2016-05-05 LAB — SAMPLE TO BLOOD BANK

## 2016-05-05 NOTE — Progress Notes (Signed)
New referral from recent hospitalization for anemia. Offers no complaints. States is nervous about appt today.

## 2016-05-06 LAB — CEA: CEA: 3.9 ng/mL (ref 0.0–4.7)

## 2016-05-12 ENCOUNTER — Inpatient Hospital Stay: Payer: BLUE CROSS/BLUE SHIELD

## 2016-05-12 VITALS — BP 150/81 | HR 76 | Temp 99.1°F | Resp 18

## 2016-05-12 DIAGNOSIS — D509 Iron deficiency anemia, unspecified: Secondary | ICD-10-CM | POA: Diagnosis not present

## 2016-05-12 MED ORDER — SODIUM CHLORIDE 0.9 % IV SOLN
Freq: Once | INTRAVENOUS | Status: AC
  Administered 2016-05-12: 14:00:00 via INTRAVENOUS
  Filled 2016-05-12: qty 1000

## 2016-05-12 MED ORDER — SODIUM CHLORIDE 0.9 % IV SOLN
510.0000 mg | Freq: Once | INTRAVENOUS | Status: AC
  Administered 2016-05-12: 510 mg via INTRAVENOUS
  Filled 2016-05-12: qty 17

## 2016-05-19 ENCOUNTER — Inpatient Hospital Stay: Payer: BLUE CROSS/BLUE SHIELD

## 2016-05-19 VITALS — BP 153/89 | HR 85 | Temp 99.0°F | Resp 18

## 2016-05-19 DIAGNOSIS — D509 Iron deficiency anemia, unspecified: Secondary | ICD-10-CM

## 2016-05-19 MED ORDER — SODIUM CHLORIDE 0.9 % IV SOLN
510.0000 mg | Freq: Once | INTRAVENOUS | Status: AC
  Administered 2016-05-19: 510 mg via INTRAVENOUS
  Filled 2016-05-19: qty 17

## 2016-05-19 MED ORDER — SODIUM CHLORIDE 0.9 % IV SOLN
Freq: Once | INTRAVENOUS | Status: AC
  Administered 2016-05-19: 14:00:00 via INTRAVENOUS
  Filled 2016-05-19: qty 1000

## 2016-06-21 ENCOUNTER — Ambulatory Visit: Payer: BLUE CROSS/BLUE SHIELD | Admitting: Anesthesiology

## 2016-06-21 ENCOUNTER — Encounter
Admission: RE | Disposition: A | Discharge status: Home / Self Care | Payer: Self-pay | Source: Ambulatory Visit | Attending: Gastroenterology

## 2016-06-21 ENCOUNTER — Encounter: Payer: Self-pay | Admitting: *Deleted

## 2016-06-21 ENCOUNTER — Ambulatory Visit
Admission: RE | Admit: 2016-06-21 | Discharge: 2016-06-21 | Disposition: A | Discharge status: Home / Self Care | Payer: BLUE CROSS/BLUE SHIELD | Source: Ambulatory Visit | Attending: Gastroenterology | Admitting: Gastroenterology

## 2016-06-21 DIAGNOSIS — Z79899 Other long term (current) drug therapy: Secondary | ICD-10-CM | POA: Insufficient documentation

## 2016-06-21 DIAGNOSIS — I10 Essential (primary) hypertension: Secondary | ICD-10-CM | POA: Insufficient documentation

## 2016-06-21 DIAGNOSIS — Z1211 Encounter for screening for malignant neoplasm of colon: Secondary | ICD-10-CM | POA: Diagnosis present

## 2016-06-21 DIAGNOSIS — D123 Benign neoplasm of transverse colon: Secondary | ICD-10-CM | POA: Diagnosis not present

## 2016-06-21 DIAGNOSIS — M109 Gout, unspecified: Secondary | ICD-10-CM | POA: Diagnosis not present

## 2016-06-21 DIAGNOSIS — D649 Anemia, unspecified: Secondary | ICD-10-CM | POA: Diagnosis not present

## 2016-06-21 DIAGNOSIS — F17210 Nicotine dependence, cigarettes, uncomplicated: Secondary | ICD-10-CM | POA: Insufficient documentation

## 2016-06-21 HISTORY — PX: COLONOSCOPY WITH PROPOFOL: SHX5780

## 2016-06-21 SURGERY — COLONOSCOPY WITH PROPOFOL
Anesthesia: General

## 2016-06-21 MED ORDER — LIDOCAINE 2% (20 MG/ML) 5 ML SYRINGE
INTRAMUSCULAR | Status: DC | PRN
  Administered 2016-06-21: 40 mg via INTRAVENOUS

## 2016-06-21 MED ORDER — MIDAZOLAM HCL 5 MG/5ML IJ SOLN
INTRAMUSCULAR | Status: DC | PRN
  Administered 2016-06-21: 1 mg via INTRAVENOUS

## 2016-06-21 MED ORDER — PROPOFOL 500 MG/50ML IV EMUL
INTRAVENOUS | Status: DC | PRN
  Administered 2016-06-21: 140 ug/kg/min via INTRAVENOUS

## 2016-06-21 MED ORDER — SODIUM CHLORIDE 0.9 % IV SOLN
INTRAVENOUS | Status: DC
  Administered 2016-06-21 (×2): via INTRAVENOUS

## 2016-06-21 MED ORDER — PROPOFOL 10 MG/ML IV BOLUS
INTRAVENOUS | Status: DC | PRN
  Administered 2016-06-21: 100 mg via INTRAVENOUS

## 2016-06-21 NOTE — Anesthesia Postprocedure Evaluation (Signed)
Anesthesia Post Note  Patient: Dillon Castillo  Procedure(s) Performed: Procedure(s) (LRB): COLONOSCOPY WITH PROPOFOL (N/A)  Patient location during evaluation: Endoscopy Anesthesia Type: General Level of consciousness: awake and alert and oriented Pain management: pain level controlled Vital Signs Assessment: post-procedure vital signs reviewed and stable Respiratory status: spontaneous breathing, nonlabored ventilation and respiratory function stable Cardiovascular status: blood pressure returned to baseline and stable Postop Assessment: no signs of nausea or vomiting Anesthetic complications: no    Last Vitals:  Vitals:   06/21/16 0936 06/21/16 0946  BP: (!) 137/91 (!) 147/92  Pulse: 77 81  Resp: 16 19  Temp:      Last Pain:  Vitals:   06/21/16 0906  TempSrc: Tympanic                 [name]

## 2016-06-21 NOTE — Anesthesia Preprocedure Evaluation (Signed)
Anesthesia Evaluation  Patient identified by MRN, date of birth, ID band Patient awake    Reviewed: Allergy & Precautions, NPO status , Patient's Chart, lab work & pertinent test results, reviewed documented beta blocker date and time   History of Anesthesia Complications Negative for: history of anesthetic complications  Airway Mallampati: II  TM Distance: >3 FB Neck ROM: Full    Dental no notable dental hx.    Pulmonary neg sleep apnea, neg COPD, Current Smoker,    breath sounds clear to auscultation- rhonchi (-) wheezing      Cardiovascular Exercise Tolerance: Good hypertension, Pt. on medications and Pt. on home beta blockers (-) CAD and (-) Past MI  Rhythm:Regular Rate:Normal - Systolic murmurs and - Diastolic murmurs    Neuro/Psych negative neurological ROS  negative psych ROS   GI/Hepatic negative GI ROS, Neg liver ROS,   Endo/Other  negative endocrine ROSneg diabetes  Renal/GU negative Renal ROS     Musculoskeletal negative musculoskeletal ROS (+)   Abdominal (+) - obese,   Peds  Hematology  (+) anemia ,   Anesthesia Other Findings Past Medical History: No date: Gout No date: Heart murmur No date: Hypertension No date: Polysubstance abuse     Comment: a. ongoing tobacco and etoh abuse; b. remote               marijuana abuse in his 20's   Reproductive/Obstetrics                             Anesthesia Physical Anesthesia Plan  ASA: II  Anesthesia Plan: General   Post-op Pain Management:    Induction: Intravenous  Airway Management Planned: Natural Airway  Additional Equipment:   Intra-op Plan:   Post-operative Plan:   Informed Consent: I have reviewed the patients History and Physical, chart, labs and discussed the procedure including the risks, benefits and alternatives for the proposed anesthesia with the patient or authorized representative who has indicated  his/her understanding and acceptance.   Dental advisory given  Plan Discussed with: CRNA and Anesthesiologist  Anesthesia Plan Comments:         Anesthesia Quick Evaluation

## 2016-06-21 NOTE — H&P (Signed)
Dillon Lame, MD [address] [place], [place] [address] Phone: [phone] Fax : [phone]  Primary Care Physician:  No PCP Per Patient Primary Gastroenterologist:  Dr. Allen Norris  Pre-Procedure History & Physical: HPI:  Dillon Castillo is a 55 y.o. male is here for a screening colonoscopy.   Past Medical History:  Diagnosis Date  . Gout   . Heart murmur   . Hypertension   . Polysubstance abuse    a. ongoing tobacco and etoh abuse; b. remote marijuana abuse in his 20's    Past Surgical History:  Procedure Laterality Date  . APPENDECTOMY    . ESOPHAGOGASTRODUODENOSCOPY (EGD) WITH PROPOFOL N/A 04/19/2016   Procedure: ESOPHAGOGASTRODUODENOSCOPY (EGD) WITH PROPOFOL;  Surgeon: Dillon Lame, MD;  Location: [facility] ENDOSCOPY;  Service: Endoscopy;  Laterality: N/A;    Prior to Admission medications   Medication Sig Start Date End Date Taking? Authorizing Provider  atenolol (TENORMIN) 25 MG tablet Take 1 tablet (25 mg total) by mouth daily. 04/19/16  Yes Dustin Flock, MD  amoxicillin (AMOXIL) 500 MG capsule Take 2 capsules (1,000 mg total) by mouth 2 (two) times daily. Patient not taking: Reported on 06/21/2016 04/21/16   Dillon Lame, MD  clarithromycin (BIAXIN) 500 MG tablet Take 1 tablet (500 mg total) by mouth 2 (two) times daily. Patient not taking: Reported on 06/21/2016 04/21/16   Dillon Lame, MD  Iron, Ferrous Sulfate, 142 (45 Fe) MG TBCR Take 142 mg by mouth 2 (two) times daily. Patient not taking: Reported on 06/21/2016 04/19/16   Dustin Flock, MD  pantoprazole (PROTONIX) 40 MG tablet Take 1 tablet (40 mg total) by mouth daily. Patient not taking: Reported on 06/21/2016 04/19/16   Dustin Flock, MD    Allergies as of 04/22/2016  . (No Known Allergies)    Family History  Problem Relation Age of Onset  . Hypertension Mother   . Heart disease Father   . Heart attack Brother     Social History   Social History  . Marital status: Single    Spouse name: N/A   . Number of children: N/A  . Years of education: N/A   Occupational History  . Not on file.   Social History Main Topics  . Smoking status: Current Every Day Smoker    Packs/day: 0.50    Years: 20.00    Types: Cigarettes  . Smokeless tobacco: Never Used  . Alcohol use Yes     Comment: beer  . Drug use: No  . Sexual activity: Not on file   Other Topics Concern  . Not on file   Social History Narrative  . No narrative on file    Review of Systems: See HPI, otherwise negative ROS  Physical Exam: BP (!) 141/91   Pulse (!) 101   Temp 98.1 F (36.7 C) (Tympanic)   Resp 18   Ht 5\' 9"  (1.753 m)   Wt 149 lb (67.6 kg)   SpO2 97%   BMI 22.00 kg/m  General:   Alert,  pleasant and cooperative in NAD Head:  Normocephalic and atraumatic. Neck:  Supple; no masses or thyromegaly. Lungs:  Clear throughout to auscultation.    Heart:  Regular rate and rhythm. Abdomen:  Soft, nontender and nondistended. Normal bowel sounds, without guarding, and without rebound.   Neurologic:  Alert and  oriented x4;  grossly normal neurologically.  Impression/Plan: [name] is now here to undergo a screening colonoscopy.  Risks, benefits, and alternatives regarding colonoscopy have been reviewed  with the patient.  Questions have been answered.  All parties agreeable.

## 2016-06-21 NOTE — Transfer of Care (Signed)
Immediate Anesthesia Transfer of Care Note  Patient: Dillon Castillo  Procedure(s) Performed: Procedure(s): COLONOSCOPY WITH PROPOFOL (N/A)  Patient Location: PACU and Endoscopy Unit  Anesthesia Type:General  Level of Consciousness: sedated  Airway & Oxygen Therapy: Patient Spontanous Breathing and Patient connected to nasal cannula oxygen  Post-op Assessment: Report given to RN and Post -op Vital signs reviewed and stable  Post vital signs: Reviewed and stable  Last Vitals:  Vitals:   06/21/16 0807  BP: (!) 141/91  Pulse: (!) 101  Resp: 18  Temp: 36.7 C    Last Pain:  Vitals:   06/21/16 0807  TempSrc: Tympanic         Complications: No apparent anesthesia complications

## 2016-06-21 NOTE — Op Note (Signed)
[facility] Gastroenterology Patient Name: Dillon Castillo Procedure Date: 06/21/2016 8:40 AM MRN: [id number] Account #: [id number] Date of Birth: Sep 14, 1960 Admit Type: Outpatient Age: 55 Room: [facility] ENDO ROOM 4 Gender: Male Note Status: Finalized Procedure:            Colonoscopy Indications:          Screening for colorectal malignant neoplasm Providers:            [name] MD, MD Referring MD:         No Local Md, MD (Referring MD) Medicines:            Propofol per Anesthesia Complications:        No immediate complications. Procedure:            Pre-Anesthesia Assessment:                       - Prior to the procedure, a History and Physical was                        performed, and patient medications and allergies were                        reviewed. The patient's tolerance of previous                        anesthesia was also reviewed. The risks and benefits of                        the procedure and the sedation options and risks were                        discussed with the patient. All questions were                        answered, and informed consent was obtained. Prior                        Anticoagulants: The patient has taken no previous                        anticoagulant or antiplatelet agents. ASA Grade                        Assessment: II - A patient with mild systemic disease.                        After reviewing the risks and benefits, the patient was                        deemed in satisfactory condition to undergo the                        procedure.                       After obtaining informed consent, the colonoscope was                        passed under direct vision. Throughout the procedure,  the patient's blood pressure, pulse, and oxygen                        saturations were monitored continuously. The                        Colonoscope was introduced through the anus and      advanced to the the cecum, identified by appendiceal                        orifice and ileocecal valve. The colonoscopy was                        performed without difficulty. The patient tolerated the                        procedure well. The quality of the bowel preparation                        was fair. Findings:      The perianal and digital rectal examinations were normal.      A 3 mm polyp was found in the transverse colon. The polyp was sessile.       The polyp was removed with a cold snare. Resection and retrieval were       complete.      The exam was otherwise without abnormality. Impression:           - Preparation of the colon was fair.                       - One 3 mm polyp in the transverse colon, removed with                        a cold snare. Resected and retrieved.                       - The examination was otherwise normal. Recommendation:       - Discharge patient to home.                       - Resume previous diet.                       - Continue present medications.                       - Await pathology results.                       - Repeat colonoscopy in 5 years if polyp adenoma and 10                        years if hyperplastic Procedure Code(s):    --- Professional ---                       [phone], Colonoscopy, flexible; with removal of tumor(s),                        polyp(s), or other lesion(s) by snare technique Diagnosis Code(s):    --- Professional ---  Z12.11, Encounter for screening for malignant neoplasm                        of colon                       D12.3, Benign neoplasm of transverse colon (hepatic                        flexure or splenic flexure) CPT copyright 2016 American Medical Association. All rights reserved. The codes documented in this report are preliminary and upon coder review may  be revised to meet current compliance requirements. [name] MD, MD 06/21/2016 9:03:34 AM This report has  been signed electronically. Number of Addenda: 0 Note Initiated On: 06/21/2016 8:40 AM Scope Withdrawal Time: 0 hours 8 minutes 17 seconds  Total Procedure Duration: 0 hours 13 minutes 7 seconds       [facility]

## 2016-06-22 ENCOUNTER — Encounter: Payer: Self-pay | Admitting: Gastroenterology

## 2016-06-22 LAB — SURGICAL PATHOLOGY

## 2016-06-23 ENCOUNTER — Encounter: Payer: Self-pay | Admitting: Gastroenterology

## 2016-07-05 ENCOUNTER — Ambulatory Visit: Payer: BLUE CROSS/BLUE SHIELD | Admitting: Oncology

## 2016-07-05 ENCOUNTER — Other Ambulatory Visit: Payer: BLUE CROSS/BLUE SHIELD

## 2016-07-05 ENCOUNTER — Ambulatory Visit: Payer: BLUE CROSS/BLUE SHIELD

## 2016-07-10 NOTE — Progress Notes (Signed)
[facility]  [phone] [phone]  ID: ELLIS COWELL OB: 14-Mar-1961  MR#: [id number]  [id number]  Patient Care Team: No Pcp Per Patient as PCP - General (General Practice)  CHIEF COMPLAINT:  Iron deficiency anemia.  INTERVAL HISTORY: Patient returns to clinic today for repeat laboratory work and further evaluation. He continues to feel well and is asymptomatic. He has no neurologic complaints. He denies any recent fevers. He has a good appetite and denies weight loss. He has no further chest pain and denies any shortness of breath or cough. He denies any nausea, vomiting, constipation, or diarrhea. He denies any further melena or hematochezia. He has no urinary complaints. Patient offers no further specific complaints.  REVIEW OF SYSTEMS:   Review of Systems  Constitutional: Negative.  Negative for fever and weight loss.  Respiratory: Negative.  Negative for cough and shortness of breath.   Cardiovascular: Negative.  Negative for chest pain and leg swelling.  Gastrointestinal: Negative for abdominal pain, blood in stool, constipation, diarrhea, melena, nausea and vomiting.  Genitourinary: Negative.   Musculoskeletal: Negative.   Neurological: Negative.  Negative for weakness.  Psychiatric/Behavioral: Negative.  The patient is not nervous/anxious.     As per HPI. Otherwise, a complete review of systems is negative.  PAST MEDICAL HISTORY: Past Medical History:  Diagnosis Date  . Gout   . Heart murmur   . Hypertension   . Polysubstance abuse    a. ongoing tobacco and etoh abuse; b. remote marijuana abuse in his 20's    PAST SURGICAL HISTORY: Past Surgical History:  Procedure Laterality Date  . APPENDECTOMY    . COLONOSCOPY WITH PROPOFOL N/A 06/21/2016   Procedure: COLONOSCOPY WITH PROPOFOL;  Surgeon: [name], MD;  Location: [facility] ENDOSCOPY;  Service: Endoscopy;  Laterality: N/A;  . ESOPHAGOGASTRODUODENOSCOPY (EGD) WITH  PROPOFOL N/A 04/19/2016   Procedure: ESOPHAGOGASTRODUODENOSCOPY (EGD) WITH PROPOFOL;  Surgeon: [name], MD;  Location: [facility] ENDOSCOPY;  Service: Endoscopy;  Laterality: N/A;    FAMILY HISTORY: Family History  Problem Relation Age of Onset  . Hypertension Mother   . Heart disease Father   . Heart attack Brother     ADVANCED DIRECTIVES (Y/N):  N  HEALTH MAINTENANCE: Social History  Substance Use Topics  . Smoking status: Current Every Day Smoker    Packs/day: 0.50    Years: 20.00    Types: Cigarettes  . Smokeless tobacco: Never Used  . Alcohol use Yes     Comment: beer     Colonoscopy:  PAP:  Bone density:  Lipid panel:  No Known Allergies  Current Outpatient Prescriptions  Medication Sig Dispense Refill  . atenolol (TENORMIN) 25 MG tablet Take 1 tablet (25 mg total) by mouth daily. 30 tablet 0   No current facility-administered medications for this visit.     OBJECTIVE: Vitals:   07/12/16 1430  BP: 134/84  Pulse: 80  Resp: 18  Temp: (!) 96.3 F (35.7 C)     Body mass index is 21.26 kg/m.    ECOG FS:0 - Asymptomatic  General: Well-developed, well-nourished, no acute distress. Eyes: Pink conjunctiva, anicteric sclera. Lungs: Clear to auscultation bilaterally. Heart: Regular rate and rhythm. No rubs, murmurs, or gallops. Abdomen: Soft, nontender, nondistended. No organomegaly noted, normoactive bowel sounds. Musculoskeletal: No edema, cyanosis, or clubbing. Neuro: Alert, answering all questions appropriately. Cranial nerves grossly intact. Skin: No rashes or petechiae noted. Psych: Normal affect.  LAB RESULTS:  Lab Results  Component Value Date   NA 139  04/19/2016   K 3.8 04/19/2016   CL 105 04/19/2016   CO2 26 04/19/2016   GLUCOSE 95 04/19/2016   BUN 9 04/19/2016   CREATININE 0.61 04/19/2016   CALCIUM 9.1 04/19/2016   GFRNONAA >60 04/19/2016   GFRAA >60 04/19/2016    Lab Results  Component Value Date   WBC 4.1 07/12/2016   NEUTROABS 2.3  07/12/2016   HGB 14.2 07/12/2016   HCT 42.0 07/12/2016   MCV 97.4 07/12/2016   PLT 125 (L) 07/12/2016   Lab Results  Component Value Date   IRON 60 05/05/2016   TIBC 486 (H) 05/05/2016   IRONPCTSAT 12 (L) 05/05/2016   Lab Results  Component Value Date   FERRITIN 64 05/05/2016   Lab Results  Component Value Date   CEA 3.9 05/05/2016     STUDIES: No results found.  ASSESSMENT: Iron deficiency anemia.  PLAN:    1.  Iron deficiency anemia: Likely secondary to GI bleed. EGD results did not reveal definitive source. Colonoscopy on June 21, 2016 was essentially negative and did not reveal any source of bleeding. Patient's hemoglobin is now within normal limits and his iron stores are pending at time of dictation. He does not require additional IV iron today. Patient has also discontinued oral iron supplementation. No intervention is needed at this time. No follow-up has been scheduled. Please refer patient back if his hemoglobin declines again or there are any questions or concerns.   Patient expressed understanding and was in agreement with this plan. He also understands that He can call clinic at any time with any questions, concerns, or complaints.    Lloyd Huger, MD   07/12/2016 2:35 PM

## 2016-07-12 ENCOUNTER — Inpatient Hospital Stay: Payer: BLUE CROSS/BLUE SHIELD | Attending: Oncology | Admitting: Oncology

## 2016-07-12 ENCOUNTER — Inpatient Hospital Stay: Payer: BLUE CROSS/BLUE SHIELD

## 2016-07-12 VITALS — BP 134/84 | HR 80 | Temp 96.3°F | Resp 18 | Wt 144.0 lb

## 2016-07-12 DIAGNOSIS — I10 Essential (primary) hypertension: Secondary | ICD-10-CM | POA: Diagnosis not present

## 2016-07-12 DIAGNOSIS — Z79899 Other long term (current) drug therapy: Secondary | ICD-10-CM | POA: Insufficient documentation

## 2016-07-12 DIAGNOSIS — M109 Gout, unspecified: Secondary | ICD-10-CM | POA: Insufficient documentation

## 2016-07-12 DIAGNOSIS — D509 Iron deficiency anemia, unspecified: Secondary | ICD-10-CM

## 2016-07-12 DIAGNOSIS — F17210 Nicotine dependence, cigarettes, uncomplicated: Secondary | ICD-10-CM | POA: Diagnosis not present

## 2016-07-12 DIAGNOSIS — D500 Iron deficiency anemia secondary to blood loss (chronic): Secondary | ICD-10-CM

## 2016-07-12 LAB — CBC WITH DIFFERENTIAL/PLATELET
BASOS PCT: 1 %
Basophils Absolute: 0 10*3/uL (ref 0–0.1)
Eosinophils Absolute: 0.1 10*3/uL (ref 0–0.7)
Eosinophils Relative: 3 %
HEMATOCRIT: 42 % (ref 40.0–52.0)
HEMOGLOBIN: 14.2 g/dL (ref 13.0–18.0)
LYMPHS ABS: 1.2 10*3/uL (ref 1.0–3.6)
Lymphocytes Relative: 29 %
MCH: 32.8 pg (ref 26.0–34.0)
MCHC: 33.7 g/dL (ref 32.0–36.0)
MCV: 97.4 fL (ref 80.0–100.0)
MONO ABS: 0.5 10*3/uL (ref 0.2–1.0)
MONOS PCT: 12 %
NEUTROS ABS: 2.3 10*3/uL (ref 1.4–6.5)
Neutrophils Relative %: 57 %
Platelets: 125 10*3/uL — ABNORMAL LOW (ref 150–440)
RBC: 4.31 MIL/uL — ABNORMAL LOW (ref 4.40–5.90)
RDW: 23.1 % — AB (ref 11.5–14.5)
WBC: 4.1 10*3/uL (ref 3.8–10.6)

## 2016-07-12 LAB — FERRITIN: FERRITIN: 308 ng/mL (ref 24–336)

## 2016-07-12 LAB — IRON AND TIBC
Iron: 88 ug/dL (ref 45–182)
SATURATION RATIOS: 23 % (ref 17.9–39.5)
TIBC: 376 ug/dL (ref 250–450)
UIBC: 289 ug/dL

## 2016-07-12 LAB — SAMPLE TO BLOOD BANK

## 2016-07-12 NOTE — Progress Notes (Signed)
Patient does not offer any problems today.  

## 2017-09-16 IMAGING — CT CT ANGIO CHEST
2 of 6 series · 19 of 46 positions shown · IV contrast (APPLIED)
Comparison: None.

CLINICAL DATA: Chest pain, mid back pain

EXAM:
CT ANGIOGRAPHY CHEST WITH CONTRAST
TECHNIQUE: Multidetector CT imaging of the chest was performed using the
standard protocol during bolus administration of intravenous
contrast. Multiplanar CT image reconstructions and MIPs were
obtained to evaluate the vascular anatomy.
CONTRAST:  75 mL Isovue 370

[Series 5: thins · axial · 0.72mm/px · z∈[-322,-47]mm · 17 of 303 slices shown]
[im 14/303  lung]
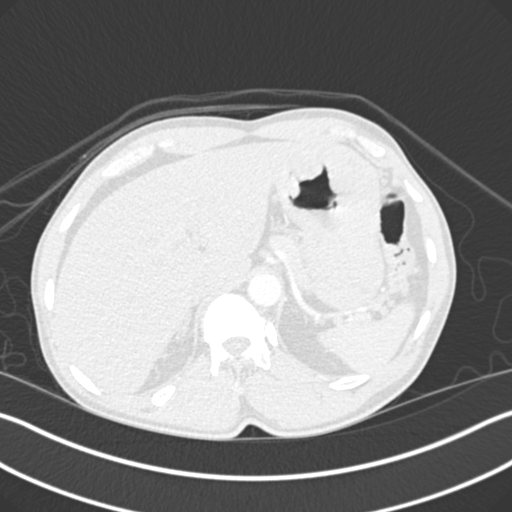
[im 27/303  soft-tissue]
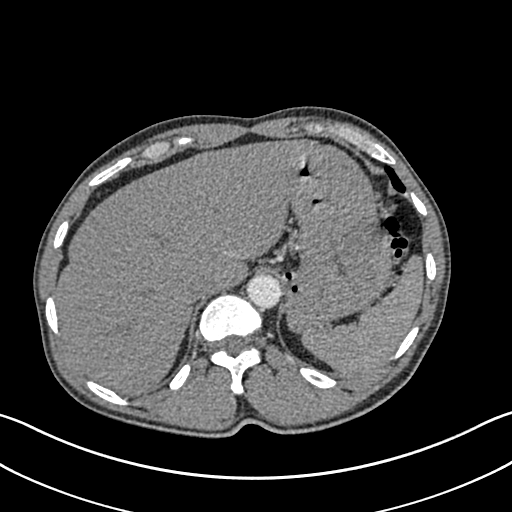
[im 53/303  lung]
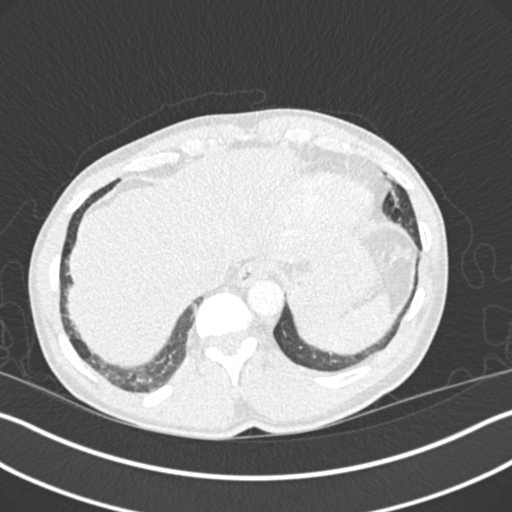
[im 66/303  soft-tissue]
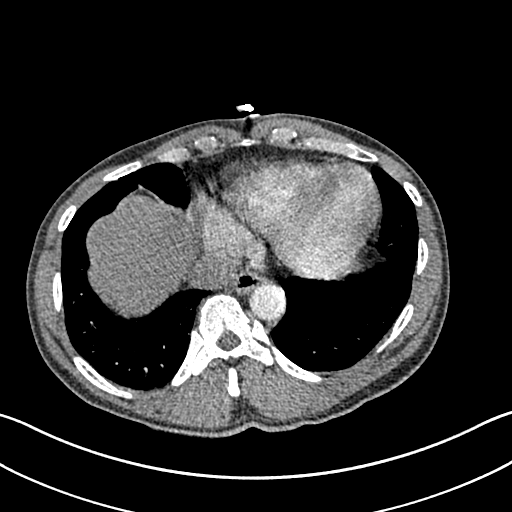
[im 79/303  lung]
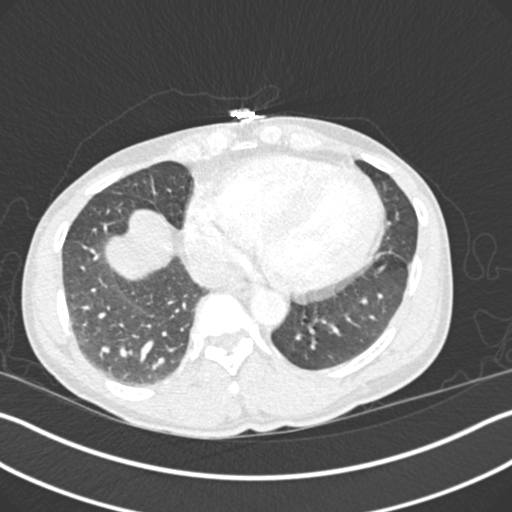
[im 106/303  soft-tissue]
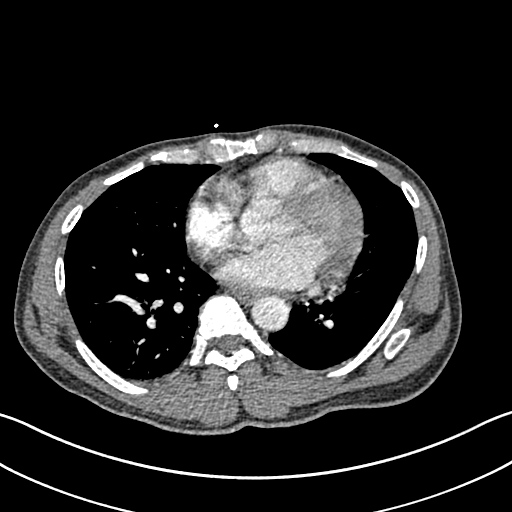
[im 119/303  lung]
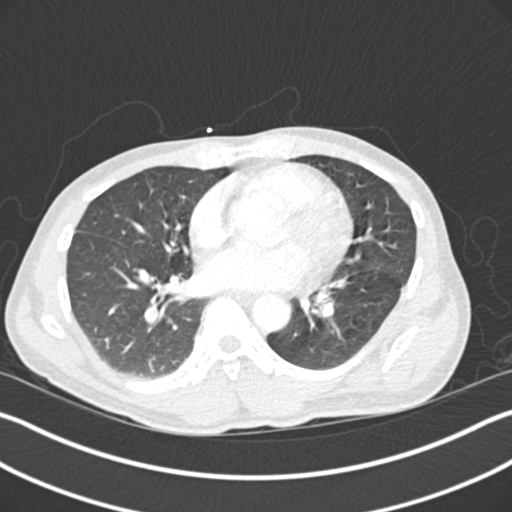
[im 132/303  soft-tissue]
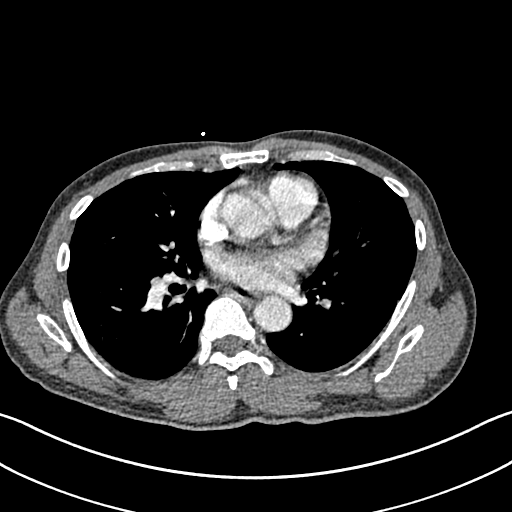
[im 158/303  lung]
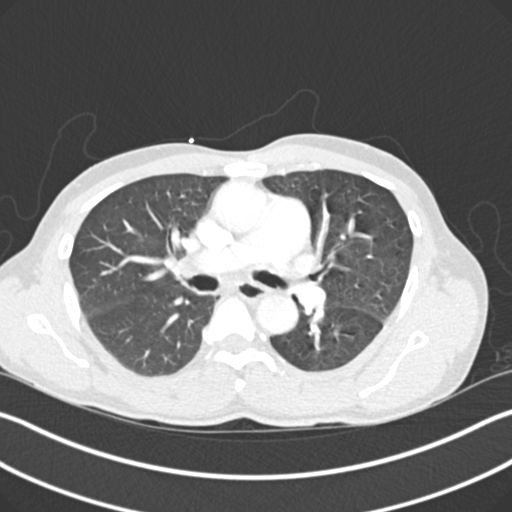
[im 171/303  soft-tissue]
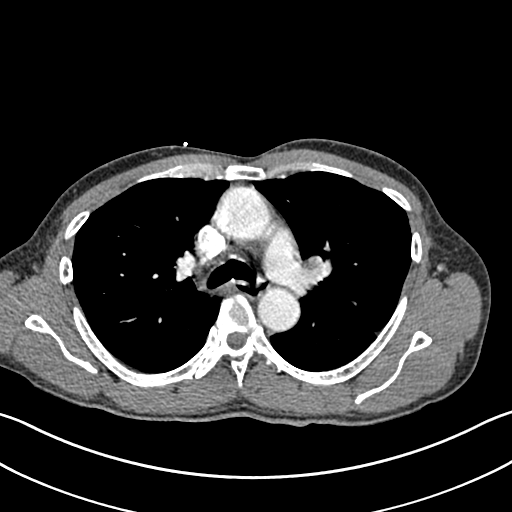
[im 184/303  lung]
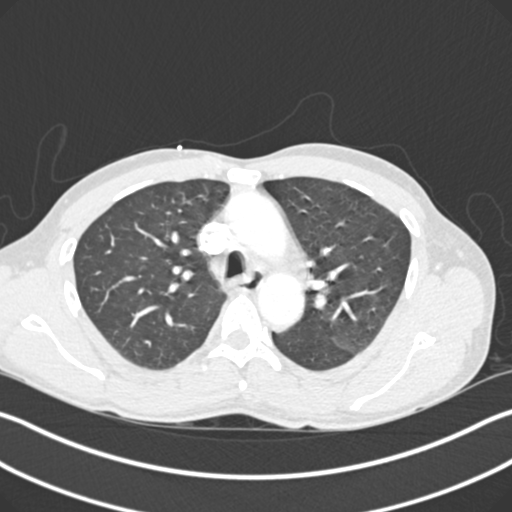
[im 197/303  soft-tissue]
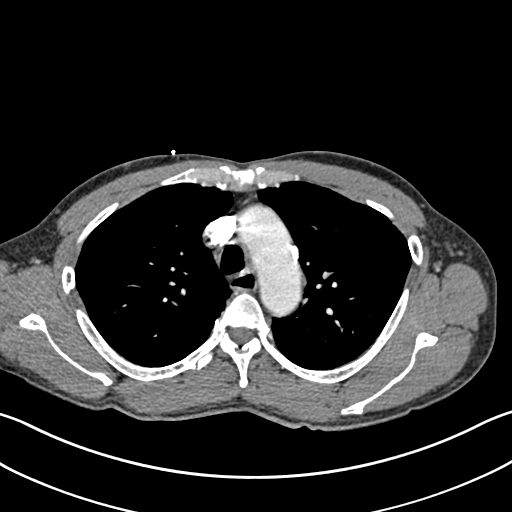
[im 224/303  lung]
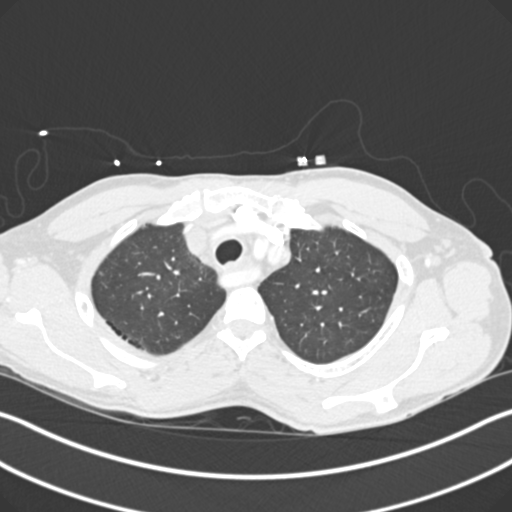
[im 237/303  soft-tissue]
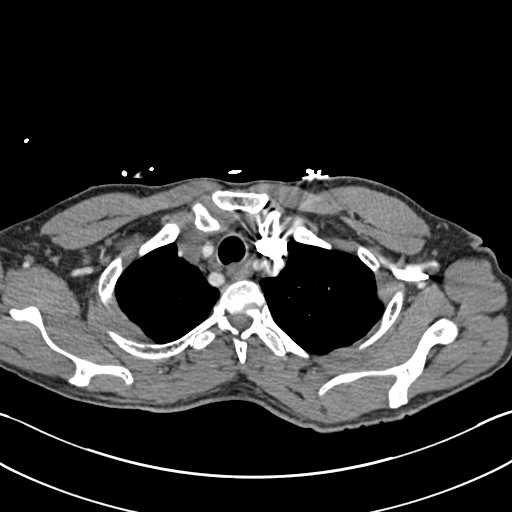
[im 250/303  lung]
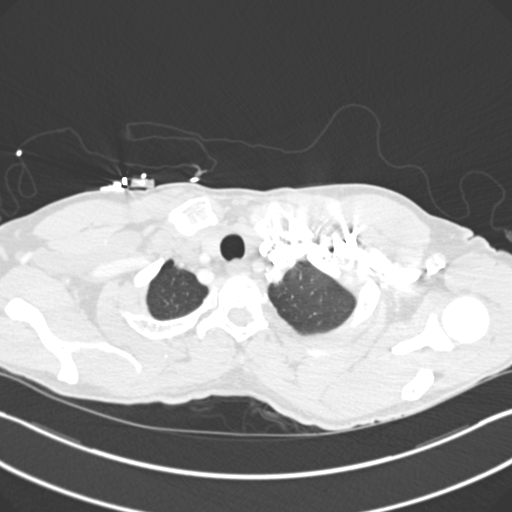
[im 276/303  soft-tissue]
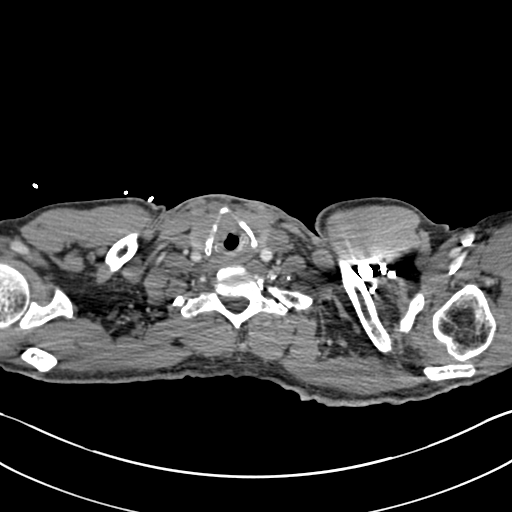
[im 289/303  lung]
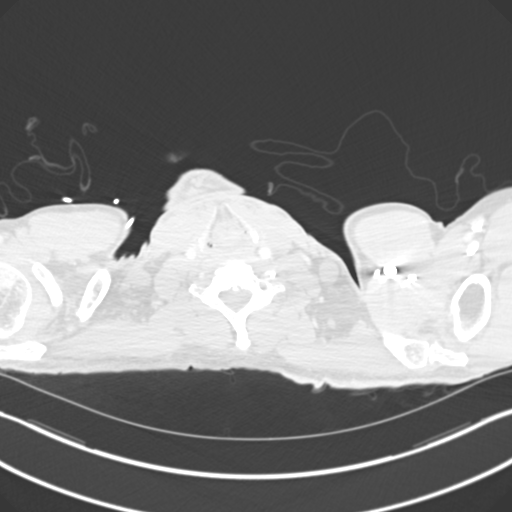

[Series 7: coronal mpr · coronal · 0.58mm/px · 2 of 66 slices shown]
[im 22/66  soft-tissue]
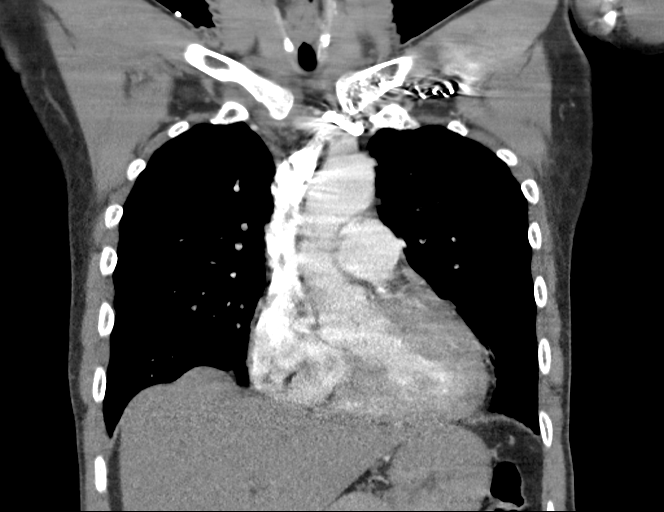
[im 44/66  soft-tissue]
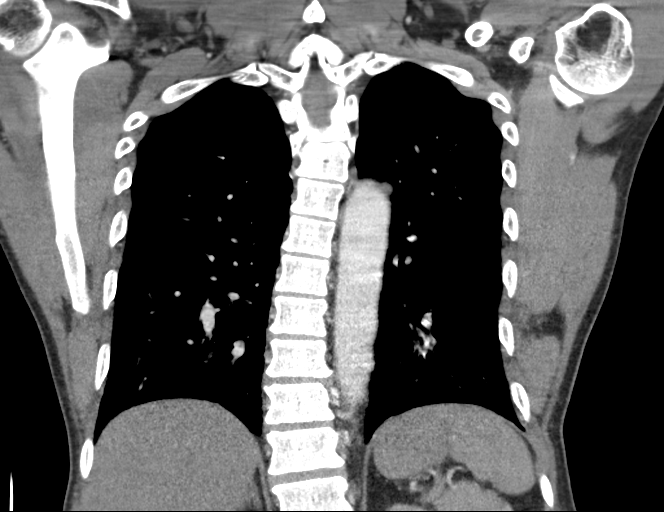

[19 of 46 positions shown; findings below may reference images not displayed]

FINDINGS: Mediastinum/Lymph Nodes: No pulmonary emboli or thoracic aortic
dissection identified. No masses or pathologically enlarged lymph
nodes identified. Aberrant right subclavian artery with a
retroesophageal course.

Lungs/Pleura: No pulmonary mass, infiltrate, or effusion.

Upper abdomen: No acute findings.

Musculoskeletal: No chest wall mass or suspicious bone lesions
identified. Dextrocurvature of the thoracic spine.

Review of the MIP images confirms the above findings.
IMPRESSION: 1. No pulmonary embolus.
2. No thoracic aortic dissection.

## 2019-10-20 ENCOUNTER — Emergency Department: Payer: Self-pay

## 2019-10-20 ENCOUNTER — Other Ambulatory Visit: Payer: Self-pay

## 2019-10-20 ENCOUNTER — Inpatient Hospital Stay
Admission: EM | Admit: 2019-10-20 | Discharge: 2019-10-23 | DRG: 378 | Disposition: A | Discharge status: Home / Self Care | Payer: Self-pay | Attending: Internal Medicine | Admitting: Internal Medicine

## 2019-10-20 ENCOUNTER — Encounter: Payer: Self-pay | Admitting: Emergency Medicine

## 2019-10-20 DIAGNOSIS — I10 Essential (primary) hypertension: Secondary | ICD-10-CM | POA: Diagnosis present

## 2019-10-20 DIAGNOSIS — M109 Gout, unspecified: Secondary | ICD-10-CM | POA: Diagnosis present

## 2019-10-20 DIAGNOSIS — K3189 Other diseases of stomach and duodenum: Secondary | ICD-10-CM | POA: Diagnosis present

## 2019-10-20 DIAGNOSIS — Z9049 Acquired absence of other specified parts of digestive tract: Secondary | ICD-10-CM

## 2019-10-20 DIAGNOSIS — Z8249 Family history of ischemic heart disease and other diseases of the circulatory system: Secondary | ICD-10-CM

## 2019-10-20 DIAGNOSIS — F17210 Nicotine dependence, cigarettes, uncomplicated: Secondary | ICD-10-CM | POA: Diagnosis present

## 2019-10-20 DIAGNOSIS — D62 Acute posthemorrhagic anemia: Secondary | ICD-10-CM | POA: Diagnosis present

## 2019-10-20 DIAGNOSIS — D649 Anemia, unspecified: Secondary | ICD-10-CM

## 2019-10-20 DIAGNOSIS — R011 Cardiac murmur, unspecified: Secondary | ICD-10-CM | POA: Diagnosis present

## 2019-10-20 DIAGNOSIS — K921 Melena: Principal | ICD-10-CM | POA: Diagnosis present

## 2019-10-20 DIAGNOSIS — K922 Gastrointestinal hemorrhage, unspecified: Secondary | ICD-10-CM

## 2019-10-20 DIAGNOSIS — K766 Portal hypertension: Secondary | ICD-10-CM | POA: Diagnosis present

## 2019-10-20 DIAGNOSIS — Z20822 Contact with and (suspected) exposure to covid-19: Secondary | ICD-10-CM | POA: Diagnosis present

## 2019-10-20 DIAGNOSIS — T39395A Adverse effect of other nonsteroidal anti-inflammatory drugs [NSAID], initial encounter: Secondary | ICD-10-CM | POA: Diagnosis present

## 2019-10-20 LAB — CBC
HCT: 10.4 % — CL (ref 39.0–52.0)
Hemoglobin: 2.5 g/dL — CL (ref 13.0–17.0)
MCH: 16.1 pg — ABNORMAL LOW (ref 26.0–34.0)
MCHC: 24 g/dL — ABNORMAL LOW (ref 30.0–36.0)
MCV: 67.1 fL — ABNORMAL LOW (ref 80.0–100.0)
Platelets: 222 10*3/uL (ref 150–400)
RBC: 1.55 MIL/uL — ABNORMAL LOW (ref 4.22–5.81)
RDW: 21.8 % — ABNORMAL HIGH (ref 11.5–15.5)
WBC: 8.4 10*3/uL (ref 4.0–10.5)
nRBC: 2.7 % — ABNORMAL HIGH (ref 0.0–0.2)

## 2019-10-20 LAB — IRON AND TIBC
Iron: 8 ug/dL — ABNORMAL LOW (ref 45–182)
Saturation Ratios: 2 % — ABNORMAL LOW (ref 17.9–39.5)
TIBC: 512 ug/dL — ABNORMAL HIGH (ref 250–450)
UIBC: 504 ug/dL

## 2019-10-20 LAB — ABO/RH: ABO/RH(D): A POS

## 2019-10-20 LAB — PROTIME-INR
INR: 1.2 (ref 0.8–1.2)
Prothrombin Time: 14.6 seconds (ref 11.4–15.2)

## 2019-10-20 LAB — BASIC METABOLIC PANEL
Anion gap: 7 (ref 5–15)
BUN: 12 mg/dL (ref 6–20)
CO2: 22 mmol/L (ref 22–32)
Calcium: 7.9 mg/dL — ABNORMAL LOW (ref 8.9–10.3)
Chloride: 103 mmol/L (ref 98–111)
Creatinine, Ser: 0.76 mg/dL (ref 0.61–1.24)
GFR calc Af Amer: >60 mL/min (ref >60)
GFR calc non Af Amer: >60 mL/min (ref >60)
Glucose, Bld: 110 mg/dL — ABNORMAL HIGH (ref 70–99)
Potassium: 3.8 mmol/L (ref 3.5–5.1)
Sodium: 132 mmol/L — ABNORMAL LOW (ref 135–145)

## 2019-10-20 LAB — RETICULOCYTES
Immature Retic Fract: 28.1 % — ABNORMAL HIGH (ref 2.3–15.9)
RBC.: 1.54 MIL/uL — ABNORMAL LOW (ref 4.22–5.81)
Retic Count, Absolute: 64.5 10*3/uL (ref 19.0–186.0)
Retic Ct Pct: 4.2 % — ABNORMAL HIGH (ref 0.4–3.1)

## 2019-10-20 LAB — PREPARE RBC (CROSSMATCH)

## 2019-10-20 LAB — FERRITIN: Ferritin: 2 ng/mL — ABNORMAL LOW (ref 24–336)

## 2019-10-20 LAB — FOLATE: Folate: 13.2 ng/mL (ref >5.9)

## 2019-10-20 LAB — APTT: aPTT: 28 seconds (ref 24–36)

## 2019-10-20 MED ORDER — PROMETHAZINE HCL 25 MG/ML IJ SOLN: 6.2500 mg | Freq: Four times a day (QID) | INTRAMUSCULAR | Status: DC | PRN

## 2019-10-20 MED ORDER — PANTOPRAZOLE SODIUM 40 MG IV SOLR: 40.0000 mg | INTRAVENOUS | Status: AC

## 2019-10-20 MED ORDER — SODIUM CHLORIDE 0.9 % IV SOLN
8.0000 mg/h | INTRAVENOUS | Status: DC
  Administered 2019-10-21 (×3): 8 mg/h via INTRAVENOUS
  Filled 2019-10-20 (×4): qty 80

## 2019-10-20 MED ORDER — ONDANSETRON HCL 4 MG/2ML IJ SOLN: 4.0000 mg | Freq: Four times a day (QID) | INTRAMUSCULAR | Status: DC | PRN

## 2019-10-20 MED ORDER — SODIUM CHLORIDE 0.9 % IV SOLN: INTRAVENOUS | Status: DC

## 2019-10-20 MED ORDER — SODIUM CHLORIDE 0.9 % IV SOLN
10.0000 mL/h | Freq: Once | INTRAVENOUS | Status: AC
  Administered 2019-10-21: 10 mL/h via INTRAVENOUS

## 2019-10-20 NOTE — ED Provider Notes (Signed)
[facility] EMERGENCY DEPARTMENT Provider Note   CSN: [id number] Arrival date & time: 10/20/19  1821     History Chief Complaint  Patient presents with  . Shortness of Breath    Dillon Castillo is a 59 y.o. male history of hypertension, previous alcohol abuse here presenting with melena and shortness of breath .  Patient states that he has been having dark stools for the last 3 weeks.  He states that for the last 2 to 3 days, he has been having shortness of breath with exertion .  Patient states that he is feeling fine when he sits down but whenever he exerts himself he gets short of breath.  Denies any chest pain.  Patient states that he has similar episode in 2017 when he required transfusion. Patient had an endoscopy and colonoscopy at that time that showed a small polyp that was not bleeding.  Patient is not currently on any blood thinners.  The history is provided by the patient.       Past Medical History:  Diagnosis Date  . Gout   . Heart murmur   . Hypertension   . Polysubstance abuse ([place])    a. ongoing tobacco and etoh abuse; b. remote marijuana abuse in his 20's    Patient Active Problem List   Diagnosis Date Noted  . GI bleed 10/20/2019  . Special screening for malignant neoplasms, colon   . Benign neoplasm of transverse colon   . Iron deficiency anemia   . Other diseases of stomach and duodenum   . Symptomatic anemia 04/17/2016  . Chest pain 04/17/2016  . HTN (hypertension) 04/17/2016  . Melena 04/17/2016    Past Surgical History:  Procedure Laterality Date  . APPENDECTOMY    . COLONOSCOPY WITH PROPOFOL N/A 06/21/2016   Procedure: COLONOSCOPY WITH PROPOFOL;  Surgeon: [name], MD;  Location: [facility] ENDOSCOPY;  Service: Endoscopy;  Laterality: N/A;  . ESOPHAGOGASTRODUODENOSCOPY (EGD) WITH PROPOFOL N/A 04/19/2016   Procedure: ESOPHAGOGASTRODUODENOSCOPY (EGD) WITH PROPOFOL;  Surgeon: [name], MD;  Location: [facility] ENDOSCOPY;   Service: Endoscopy;  Laterality: N/A;       Family History  Problem Relation Age of Onset  . Hypertension Mother   . Heart disease Father   . Heart attack Brother     Social History   Tobacco Use  . Smoking status: Current Every Day Smoker    Packs/day: 0.50    Years: 20.00    Pack years: 10.00    Types: Cigarettes  . Smokeless tobacco: Never Used  Substance Use Topics  . Alcohol use: Yes    Comment: beer  . Drug use: No    Home Medications Prior to Admission medications   Medication Sig Start Date End Date Taking? Authorizing Provider  atenolol (TENORMIN) 25 MG tablet Take 1 tablet (25 mg total) by mouth daily. Patient not taking: Reported on 10/20/2019 04/19/16   Dustin Flock, MD    Allergies    Patient has no known allergies.  Review of Systems   Review of Systems  Respiratory: Positive for shortness of breath.   Gastrointestinal: Positive for blood in stool.  All other systems reviewed and are negative.   Physical Exam Updated Vital Signs BP (!) 152/50   Pulse (!) 103   Temp 98.6 F (37 C)   Resp 17   Ht 5\' 9"  (1.753 m)   Wt 74.4 kg   SpO2 98%   BMI 24.22 kg/m   Physical Exam Vitals and nursing  note reviewed.  HENT:     Head: Normocephalic.  Eyes:     Comments: Pale   Cardiovascular:     Rate and Rhythm: Normal rate and regular rhythm.  Pulmonary:     Effort: Pulmonary effort is normal.     Breath sounds: Normal breath sounds.  Abdominal:     General: Bowel sounds are normal.     Palpations: Abdomen is soft.  Genitourinary:    Comments: Rectal- melena, no hemorrhoids, no bright red blood  Musculoskeletal:        General: Normal range of motion.     Cervical back: Normal range of motion and neck supple.  Skin:    General: Skin is warm.     Capillary Refill: Capillary refill takes less than 2 seconds.  Neurological:     General: No focal deficit present.     Mental Status: He is alert and oriented to person, place, and time.    Psychiatric:        Mood and Affect: Mood normal.        Behavior: Behavior normal.     ED Results / Procedures / Treatments   Labs (all labs ordered are listed, but only abnormal results are displayed) Labs Reviewed  BASIC METABOLIC PANEL - Abnormal; Notable for the following components:      Result Value   Sodium 132 (*)    Glucose, Bld 110 (*)    Calcium 7.9 (*)    All other components within normal limits  CBC - Abnormal; Notable for the following components:   RBC 1.55 (*)    Hemoglobin 2.5 (*)    HCT 10.4 (*)    MCV 67.1 (*)    MCH 16.1 (*)    MCHC 24.0 (*)    RDW 21.8 (*)    nRBC 2.7 (*)    All other components within normal limits  IRON AND TIBC - Abnormal; Notable for the following components:   Iron 8 (*)    TIBC 512 (*)    Saturation Ratios 2 (*)    All other components within normal limits  FERRITIN - Abnormal; Notable for the following components:   Ferritin 2 (*)    All other components within normal limits  RETICULOCYTES - Abnormal; Notable for the following components:   Retic Ct Pct 4.2 (*)    RBC. 1.54 (*)    Immature Retic Fract 28.1 (*)    All other components within normal limits  APTT  PROTIME-INR  FOLATE  VITAMIN B12  HIV ANTIBODY (ROUTINE TESTING W REFLEX)  CBC  BASIC METABOLIC PANEL  HEMOGLOBIN AND HEMATOCRIT, BLOOD  HEMOGLOBIN AND HEMATOCRIT, BLOOD  TYPE AND SCREEN  PREPARE RBC (CROSSMATCH)  TYPE AND SCREEN    EKG EKG Interpretation  Date/Time:  Sunday October 20 2019 18:28:56 EST Ventricular Rate:  117 PR Interval:  162 QRS Duration: 86 QT Interval:  438 QTC Calculation: 611 R Axis:   61 Text Interpretation: Sinus tachycardia Possible Left atrial enlargement Biventricular hypertrophy T wave abnormality, consider anterolateral ischemia Abnormal ECG When compared with ECG of 17-Apr-2016 16:22, Minimal criteria for Septal infarct are no longer Present ST no longer elevated in Inferior leads ST no longer elevated in  Anterolateral leads T wave inversion more evident in Anterolateral leads QTc prolonged likely rate related Confirmed by Wandra Arthurs [phone]) on 10/20/2019 9:27:03 PM   Radiology DG Chest 2 View  Result Date: 10/20/2019 CLINICAL DATA:  Shortness of breath mild exertion worse on Friday  EXAM: CHEST - 2 VIEW COMPARISON:  04/17/2016 FINDINGS: Heart size is stable. Hilar structures are unremarkable. Lungs are clear. No signs of consolidation or pleural effusion. Visualized skeletal structures are unremarkable. IMPRESSION: No acute cardiopulmonary disease. Electronically Signed   By: Zetta Bills M.D.   On: 10/20/2019 19:07    Procedures Procedures (including critical care time)  CRITICAL CARE Performed by: Wandra Arthurs   Total critical care time: 45 minutes  Critical care time was exclusive of separately billable procedures and treating other patients.  Critical care was necessary to treat or prevent imminent or life-threatening deterioration.  Critical care was time spent personally by me on the following activities: development of treatment plan with patient and/or surrogate as well as nursing, discussions with consultants, evaluation of patient's response to treatment, examination of patient, obtaining history from patient or surrogate, ordering and performing treatments and interventions, ordering and review of laboratory studies, ordering and review of radiographic studies, pulse oximetry and re-evaluation of patient's condition.   Medications Ordered in ED Medications  0.9 %  sodium chloride infusion (has no administration in time range)  pantoprazole (PROTONIX) 40 mg IVPB (has no administration in time range)  pantoprazole (PROTONIX) 80 mg in sodium chloride 0.9 % 250 mL (0.32 mg/mL) infusion (has no administration in time range)  0.9 %  sodium chloride infusion (has no administration in time range)  promethazine (PHENERGAN) injection 6.25 mg (has no administration in time range)     ED Course  I have reviewed the triage vital signs and the nursing notes.  Pertinent labs & imaging results that were available during my care of the patient were reviewed by me and considered in my medical decision making (see chart for details).    MDM Rules/Calculators/A&P                      Dillon Castillo is a 59 y.o. male here presenting with melena and shortness of breath with exertion.  Patient appears very pale.  Concern for possible some benign anemia.  10:30 pm His hemoglobin is 2.5.  BUN/creatinine is normal.  He is guaiac positive with melena. Talked to Dr. Alice Reichert from GI, he will see patient in AM. Talked to Dr. Flossie Buffy from hospitalist. She is concerned for Hg of 2.5.  She recommend admission to ICU.  11:15 PM ICU to admit for symptomatic anemia. Started protonix IV bolus and drip and ordered 4 U PRBC.   Final Clinical Impression(s) / ED Diagnoses Final diagnoses:  Melena    Rx / DC Orders ED Discharge Orders    None       Drenda Freeze, MD 10/20/19 2316

## 2019-10-20 NOTE — ED Triage Notes (Signed)
PT to ER states SHOB on mild exertion for last 2 weeks.  Pt states got worse on Friday.

## 2019-10-20 NOTE — H&P (Signed)
Name: Dillon Castillo MRN: [id number] DOB: 12-08-1960    ADMISSION DATE:  10/20/2019 CONSULTATION DATE:  10/20/2019  REFERRING MD :  Dr. Darl Householder  CHIEF COMPLAINT:  Shortness of Breath upon exertion  BRIEF PATIENT DESCRIPTION:  59 year old male admitted 10/20/2019 with symptomatic anemia secondary to acute blood loss anemia in the setting of GI bleed (likely due to NSAID use).  Hemoglobin found to be 2.5, requiring blood transfusions.  Placed on Protonix drip, GI consulted.  SIGNIFICANT EVENTS  2/28-admission ICU 2/28>> GI consulted 2/28-to receive 4 units packed red blood cells  STUDIES:  2/28-chest x-ray>>No acute cardiopulmonary disease.  CULTURES: SARS-CoV-2 PCR 3/1>> negative Influenza PCR 3/1>> negative MRSA PCR 3/1>> negative  ANTIBIOTICS: N/A  HISTORY OF PRESENT ILLNESS:  Mr. Dillon Castillo is a 59 year old male with a past medical history of alcohol abuse who presents to [facility] ED on 10/20/2019 with complaints of dyspnea on exertion.  He reports he has been having dark/burgundy stools for the last 3 weeks.  He reports that in the last 2 to 3 days he has been having shortness of breath with minimal exertion.  He denies dizziness, chest pain, cough, fever, chills, abdominal pain, dysuria.  He had an endoscopy and colonoscopy in 2017 which showed a nonbleeding small polyp.  He denies being on blood thinners.  He does report that he has been taking ibuprofen 400 mg 2 tablets twice a day and Aleve 4 times a day for right leg pain up until approximately 3 weeks ago.  Upon presentation to the ED he was noted to be tachycardic, and pale.  He was however normotensive and afebrile.  He is guaiac positive with melena.  Initial work-up in the ED revealed hemoglobin 2.5, RBC 1.55, hematocrit 10.4, sodium 132, glucose 110.  Chest x-ray is normal.  He was ordered for 4 units of packed red blood cells, and placed on IV Protonix bolus and drip.  PCCM is consulted to admit the patient to ICU for further  work-up and treatment of symptomatic anemia in the setting of acute blood loss anemia due to GI bleed.  ED provider spoke with Gastroenterology who will see the patient in the morning.  PAST MEDICAL HISTORY :   has a past medical history of Gout, Heart murmur, Hypertension, and Polysubstance abuse ([place]).  has a past surgical history that includes Appendectomy; Esophagogastroduodenoscopy (egd) with propofol (N/A, 04/19/2016); and Colonoscopy with propofol (N/A, 06/21/2016). Prior to Admission medications   Medication Sig Start Date End Date Taking? Authorizing Provider  atenolol (TENORMIN) 25 MG tablet Take 1 tablet (25 mg total) by mouth daily. Patient not taking: Reported on 10/20/2019 04/19/16   Dustin Flock, MD   No Known Allergies  FAMILY HISTORY:  family history includes Heart attack in his brother; Heart disease in his father; Hypertension in his mother. SOCIAL HISTORY:  reports that he has been smoking cigarettes. He has a 10.00 pack-year smoking history. He has never used smokeless tobacco. He reports current alcohol use. He reports that he does not use drugs.   COVID-19 DISASTER DECLARATION:  FULL CONTACT PHYSICAL EXAMINATION WAS NOT POSSIBLE DUE TO TREATMENT OF COVID-19 AND  CONSERVATION OF PERSONAL PROTECTIVE EQUIPMENT, LIMITED EXAM FINDINGS INCLUDE-  Patient assessed or the symptoms described in the history of present illness.  In the context of the Global COVID-19 pandemic, which necessitated consideration that the patient might be at risk for infection with the SARS-CoV-2 virus that causes COVID-19, Institutional protocols and algorithms that pertain to the evaluation of patients  at risk for COVID-19 are in a state of rapid change based on information released by regulatory bodies including the CDC and federal and state organizations. These policies and algorithms were followed during the patient's care while in hospital.  REVIEW OF SYSTEMS:  Positives in  BOLD Constitutional: Negative for fever, chills, weight loss, malaise/fatigue and diaphoresis.  HENT: Negative for hearing loss, ear pain, nosebleeds, congestion, sore throat, neck pain, tinnitus and ear discharge.   Eyes: Negative for blurred vision, double vision, photophobia, pain, discharge and redness.  Respiratory: Negative for cough, hemoptysis, sputum production, +shortness of breath on exertion, wheezing and stridor.   Cardiovascular: Negative for chest pain, palpitations, orthopnea, claudication, + bilateral leg swelling and PND.  Gastrointestinal: Negative for heartburn, nausea, vomiting, abdominal pain, diarrhea, constipation, blood in stool and +melena.  Genitourinary: Negative for dysuria, urgency, frequency, hematuria and flank pain.  Musculoskeletal: Negative for myalgias, back pain, joint pain and falls.  Skin: Negative for itching and rash.  Neurological: Negative for dizziness, tingling, tremors, sensory change, speech change, focal weakness, seizures, loss of consciousness, weakness and headaches.  Endo/Heme/Allergies: Negative for environmental allergies and polydipsia. Does not bruise/bleed easily.  SUBJECTIVE:  Reports shortness of breath upon exertion (none while at rest) Denies abdominal pain, N/V, Fever, chills, dizziness, palpitations On room air, Blood currently infusing  VITAL SIGNS: Temp:  [98.6 F (37 C)] 98.6 F (37 C) (02/28 1826) Pulse Rate:  [32-114] 103 (02/28 2215) Resp:  [17-18] 17 (02/28 2215) BP: (137-152)/(50) 152/50 (02/28 2120) SpO2:  [94 %-100 %] 98 % (02/28 2215) Weight:  [74.4 kg] 74.4 kg (02/28 1826)  PHYSICAL EXAMINATION: General:  Acutely ill appearing male, sitting on bed, on room air, in NAD Neuro:  Awake, A&Ox4, follows commands, no focal deficits, speech clear HEENT:  Atraumatic, normocephalic, neck supple, no JVD, pupils PERRLA Cardiovascular:  Tachycardia, regular rhythm, s1s2, no M/R/G, 2+ pulses throughout Lungs:  Clear to  auscultation bilaterally, even, nonlabored, normal effort Abdomen:  Soft, nontender, nondistended, no guarding or rebound tenderness, BS+ x4 Musculoskeletal:  Normal bulk and tone, no deformities, 1+ edema BLE Skin:  Warm and dry, pale.  No obvious rashes, lesions, or ulcerations.  Recent Labs  Lab 10/20/19 1921  NA 132*  K 3.8  CL 103  CO2 22  BUN 12  CREATININE 0.76  GLUCOSE 110*   Recent Labs  Lab 10/20/19 2020  HGB 2.5*  HCT 10.4*  WBC 8.4  PLT 222   DG Chest 2 View  Result Date: 10/20/2019 CLINICAL DATA:  Shortness of breath mild exertion worse on Friday EXAM: CHEST - 2 VIEW COMPARISON:  04/17/2016 FINDINGS: Heart size is stable. Hilar structures are unremarkable. Lungs are clear. No signs of consolidation or pleural effusion. Visualized skeletal structures are unremarkable. IMPRESSION: No acute cardiopulmonary disease. Electronically Signed   By: Zetta Bills M.D.   On: 10/20/2019 19:07    ASSESSMENT / PLAN:  Symptomatic anemia due to acute Blood loss anemia in setting of GI Bleed (likely due to NSAID use) -Monitor for S/Sx of bleeding -Trend CBC (H&H q6h) -SCD's for VTE Prophylaxis  -Transfuse for Hgb <8 -ED Provider ordered 4 units pRBC's -NPO -Protonix bolus and drip -GI consulted, appreciate input           Disposition: ICU Goals of care: Full code VTE prophylaxis: SCDs (no chemical prophylaxis due to GI bleed) Updates: Updated patient at bedside 10/20/2019   Darel Hong, [facility] [place] Pager: [phone]  10/20/2019, 10:54 PM

## 2019-10-20 NOTE — ED Notes (Signed)
Labs redrawn per lab request

## 2019-10-21 DIAGNOSIS — K921 Melena: Principal | ICD-10-CM

## 2019-10-21 LAB — CBC WITH DIFFERENTIAL/PLATELET
Abs Immature Granulocytes: 0.13 10*3/uL — ABNORMAL HIGH (ref 0.00–0.07)
Basophils Absolute: 0 10*3/uL (ref 0.0–0.1)
Basophils Relative: 0 %
Eosinophils Absolute: 0.1 10*3/uL (ref 0.0–0.5)
Eosinophils Relative: 1 %
HCT: 22.3 % — ABNORMAL LOW (ref 39.0–52.0)
Hemoglobin: 6.6 g/dL — ABNORMAL LOW (ref 13.0–17.0)
Immature Granulocytes: 2 %
Lymphocytes Relative: 11 %
Lymphs Abs: 0.7 10*3/uL (ref 0.7–4.0)
MCH: 23 pg — ABNORMAL LOW (ref 26.0–34.0)
MCHC: 29.6 g/dL — ABNORMAL LOW (ref 30.0–36.0)
MCV: 77.7 fL — ABNORMAL LOW (ref 80.0–100.0)
Monocytes Absolute: 0.9 10*3/uL (ref 0.1–1.0)
Monocytes Relative: 13 %
Neutro Abs: 5.1 10*3/uL (ref 1.7–7.7)
Neutrophils Relative %: 73 %
Platelets: 175 10*3/uL (ref 150–400)
RBC: 2.87 MIL/uL — ABNORMAL LOW (ref 4.22–5.81)
RDW: 20 % — ABNORMAL HIGH (ref 11.5–15.5)
Smear Review: NORMAL
WBC: 7.1 10*3/uL (ref 4.0–10.5)
nRBC: 3.1 % — ABNORMAL HIGH (ref 0.0–0.2)

## 2019-10-21 LAB — BASIC METABOLIC PANEL
Anion gap: 6 (ref 5–15)
BUN: 10 mg/dL (ref 6–20)
CO2: 22 mmol/L (ref 22–32)
Calcium: 7.9 mg/dL — ABNORMAL LOW (ref 8.9–10.3)
Chloride: 110 mmol/L (ref 98–111)
Creatinine, Ser: 0.76 mg/dL (ref 0.61–1.24)
GFR calc Af Amer: >60 mL/min (ref >60)
GFR calc non Af Amer: >60 mL/min (ref >60)
Glucose, Bld: 98 mg/dL (ref 70–99)
Potassium: 4.2 mmol/L (ref 3.5–5.1)
Sodium: 138 mmol/L (ref 135–145)

## 2019-10-21 LAB — PREPARE RBC (CROSSMATCH)

## 2019-10-21 LAB — CBC
HCT: 14.9 % — CL (ref 39.0–52.0)
Hemoglobin: 4.1 g/dL — CL (ref 13.0–17.0)
MCH: 20.9 pg — ABNORMAL LOW (ref 26.0–34.0)
MCHC: 27.5 g/dL — ABNORMAL LOW (ref 30.0–36.0)
MCV: 76 fL — ABNORMAL LOW (ref 80.0–100.0)
Platelets: 181 10*3/uL (ref 150–400)
RBC: 1.96 MIL/uL — ABNORMAL LOW (ref 4.22–5.81)
RDW: 22.5 % — ABNORMAL HIGH (ref 11.5–15.5)
WBC: 5.9 10*3/uL (ref 4.0–10.5)
nRBC: 2.2 % — ABNORMAL HIGH (ref 0.0–0.2)

## 2019-10-21 LAB — HIV ANTIBODY (ROUTINE TESTING W REFLEX): HIV Screen 4th Generation wRfx: NONREACTIVE

## 2019-10-21 LAB — PATHOLOGIST SMEAR REVIEW

## 2019-10-21 LAB — VITAMIN B12: Vitamin B-12: 552 pg/mL (ref 180–914)

## 2019-10-21 LAB — RESPIRATORY PANEL BY RT PCR (FLU A&B, COVID)
Influenza A by PCR: NEGATIVE
Influenza B by PCR: NEGATIVE
SARS Coronavirus 2 by RT PCR: NEGATIVE

## 2019-10-21 LAB — MRSA PCR SCREENING: MRSA by PCR: NEGATIVE

## 2019-10-21 LAB — GLUCOSE, CAPILLARY: Glucose-Capillary: 110 mg/dL — ABNORMAL HIGH (ref 70–99)

## 2019-10-21 MED ORDER — CHLORHEXIDINE GLUCONATE CLOTH 2 % EX PADS
6.0000 | MEDICATED_PAD | Freq: Every day | CUTANEOUS | Status: DC
  Administered 2019-10-22: 6 via TOPICAL

## 2019-10-21 MED ORDER — FOLIC ACID 1 MG PO TABS
1.0000 mg | ORAL_TABLET | Freq: Every day | ORAL | Status: DC
  Administered 2019-10-22 – 2019-10-23 (×2): 1 mg via ORAL
  Filled 2019-10-21 (×2): qty 1

## 2019-10-21 MED ORDER — THIAMINE HCL 100 MG PO TABS
100.0000 mg | ORAL_TABLET | Freq: Every day | ORAL | Status: DC
  Administered 2019-10-22 – 2019-10-23 (×2): 100 mg via ORAL
  Filled 2019-10-21 (×2): qty 1

## 2019-10-21 MED ORDER — THIAMINE HCL 100 MG/ML IJ SOLN
Freq: Once | INTRAVENOUS | Status: AC
  Filled 2019-10-21: qty 1000

## 2019-10-21 MED ORDER — FUROSEMIDE 10 MG/ML IJ SOLN
40.0000 mg | Freq: Once | INTRAMUSCULAR | Status: AC
  Administered 2019-10-21: 40 mg via INTRAVENOUS

## 2019-10-21 MED ORDER — SODIUM CHLORIDE 0.9% IV SOLUTION: Freq: Once | INTRAVENOUS | Status: AC

## 2019-10-21 NOTE — Progress Notes (Signed)
Patient voiced he has been in contact with family but they are sleeping at this time. Patient gave permission to call family, RN will call in the morning.

## 2019-10-21 NOTE — Progress Notes (Signed)
Blood transfusion therapy of 4 units PRBC complete.  Patient vitals WDL. No reactions to note.

## 2019-10-21 NOTE — Progress Notes (Signed)
Upon assessment at [facility], pt resting in bed comfortably. See flowsheet for assessment. Pt given opportunity to ask questions, no questions at this time. Pt reminded he is to be NPO at midnight in preparation for his EGD on 10/22/19. Pt agreeable to plan of care. Unit of blood transfusing. Will CTM.

## 2019-10-21 NOTE — Progress Notes (Signed)
eLink Physician-Brief Progress Note Patient Name: Dillon Castillo DOB: [birth year] MRN: [id number]   Date of Service  10/21/2019  HPI/Events of Note  19 M presented with progressive worsening of dyspnea on exertion and reports of dark stools over the past 3 weeks. Hgb 2.5, ED/colonoscopy 3 years ago note of polyps. He has been taking Ibuprofen 400 mg 2 tabs twice a day and Aleve 4 times a time for right leg pain which has resolved about 2 weeks ago.  eICU Interventions  Symptomatic anemia secondary  To GI bleed. Suspect ulcer from NSAID use. Started on PPI and GI to see patient in the morning. Transfusion ordered by bedside team     Intervention Category Major Interventions: Hemorrhage - evaluation and management Evaluation Type: New Patient Evaluation  Judd Lien 10/21/2019, 12:17 AM

## 2019-10-21 NOTE — Progress Notes (Signed)
Pt admitted w/ acute blood loss anemia, melena and will have an EGD tomorrow so will be NPO after midnight as per ICU attending. Hospitalist service will take over service tomorrow

## 2019-10-21 NOTE — Consult Note (Signed)
Dillon Lame, MD [facility]  [address], [place], [place] [address] Phone: [phone] Fax : [phone]  Consultation  Referring Provider:     Dr. Dewaine Conger Primary Care Physician:  Patient, No Pcp Per Primary Gastroenterologist:  Dr. Allen Norris         Reason for Consultation:     Anemia  Date of Admission:  10/20/2019 Date of Consultation:  10/21/2019         HPI:   Dillon Castillo is a 59 y.o. male who has a history of having a EGD and colonoscopy by me in 2017.  The EGD was done for iron deficiency anemia and a colonoscopy with screening.  The patient was admitted yesterday to the hospital with symptomatic anemia and was found to have a hemoglobin of 2.5.  The patient has received 4 units of blood and was started on a Protonix drip.  The patient reports that he was feeling tired and weak.  He does have a history of alcohol abuse.  He also reports that he has been having dark/burgundy stools for the last couple of weeks.  He has been on NSAIDs every day for leg pain.  He states that he stopped the NSAIDs a few days ago because he ran out of the medicine.  He reports that he was taking 400 mg at 2 tablets a day and Aleve 4 times a day.  In the ER the patient was found to have tachycardia.  Due to his profound anemia the patient was sent to the ICU.  The patient denies any abdominal pain at the present time.  Past Medical History:  Diagnosis Date  . Gout   . Heart murmur   . Hypertension   . Polysubstance abuse ([place])    a. ongoing tobacco and etoh abuse; b. remote marijuana abuse in his 20's    Past Surgical History:  Procedure Laterality Date  . APPENDECTOMY    . COLONOSCOPY WITH PROPOFOL N/A 06/21/2016   Procedure: COLONOSCOPY WITH PROPOFOL;  Surgeon: Dillon Lame, MD;  Location: [facility] ENDOSCOPY;  Service: Endoscopy;  Laterality: N/A;  . ESOPHAGOGASTRODUODENOSCOPY (EGD) WITH PROPOFOL N/A 04/19/2016   Procedure: ESOPHAGOGASTRODUODENOSCOPY (EGD) WITH PROPOFOL;  Surgeon: Dillon Lame,  MD;  Location: [facility] ENDOSCOPY;  Service: Endoscopy;  Laterality: N/A;    Prior to Admission medications   Medication Sig Start Date End Date Taking? Authorizing Provider  atenolol (TENORMIN) 25 MG tablet Take 1 tablet (25 mg total) by mouth daily. Patient not taking: Reported on 10/20/2019 04/19/16   Dustin Flock, MD    Family History  Problem Relation Age of Onset  . Hypertension Mother   . Heart disease Father   . Heart attack Brother      Social History   Tobacco Use  . Smoking status: Current Every Day Smoker    Packs/day: 0.50    Years: 20.00    Pack years: 10.00    Types: Cigarettes  . Smokeless tobacco: Never Used  Substance Use Topics  . Alcohol use: Yes    Comment: beer  . Drug use: No    Allergies as of 10/20/2019  . (No Known Allergies)    Review of Systems:    All systems reviewed and negative except where noted in HPI.   Physical Exam:  Vital signs in last 24 hours: Temp:  [97.7 F (36.5 C)-99.1 F (37.3 C)] 98.6 F (37 C) (03/01 1247) Pulse Rate:  [32-114] 92 (03/01 1247) Resp:  [13-25] 19 (03/01 1247) BP: (  124-169)/(50-96) 145/71 (03/01 1247) SpO2:  [92 %-100 %] 100 % (03/01 1247) Weight:  [74.4 kg] 74.4 kg (03/01 0114) Last BM Date: 10/20/19 General:   Pleasant, cooperative in NAD Head:  Normocephalic and atraumatic. Eyes:   No icterus.   Conjunctiva pink. PERRLA. Ears:  Normal auditory acuity. Neck:  Supple; no masses or thyroidomegaly Lungs: Respirations even and unlabored. Lungs clear to auscultation bilaterally.   No wheezes, crackles, or rhonchi.  Heart:  Regular rate and rhythm;  Without murmur, clicks, rubs or gallops Abdomen:  Soft, nondistended, nontender. Normal bowel sounds. No appreciable masses or hepatomegaly.  No rebound or guarding.  Rectal:  Not performed. Msk:  Symmetrical without gross deformities.    Extremities:  Without edema, cyanosis or clubbing. Neurologic:  Alert and oriented x3;  grossly normal  neurologically. Skin:  Intact without significant lesions or rashes. Cervical Nodes:  No significant cervical adenopathy. Psych:  Alert and cooperative. Normal affect.  LAB RESULTS: Recent Labs    10/20/19 2020 10/21/19 0435  WBC 8.4 5.9  HGB 2.5* 4.1*  HCT 10.4* 14.9*  PLT 222 181   BMET Recent Labs    10/20/19 1921 10/21/19 0435  NA 132* 138  K 3.8 4.2  CL 103 110  CO2 22 22  GLUCOSE 110* 98  BUN 12 10  CREATININE 0.76 0.76  CALCIUM 7.9* 7.9*   LFT No results for input(s): PROT, ALBUMIN, AST, ALT, ALKPHOS, BILITOT, BILIDIR, IBILI in the last 72 hours. PT/INR Recent Labs    10/20/19 2150  LABPROT 14.6  INR 1.2    STUDIES: DG Chest 2 View  Result Date: 10/20/2019 CLINICAL DATA:  Shortness of breath mild exertion worse on Friday EXAM: CHEST - 2 VIEW COMPARISON:  04/17/2016 FINDINGS: Heart size is stable. Hilar structures are unremarkable. Lungs are clear. No signs of consolidation or pleural effusion. Visualized skeletal structures are unremarkable. IMPRESSION: No acute cardiopulmonary disease. Electronically Signed   By: Zetta Bills M.D.   On: 10/20/2019 19:07      Impression / Plan:   Assessment: Active Problems:   GI bleed   Dillon Castillo is a 59 y.o. y/o male with NSAID abuse and black stools.  The patient is being transfused at this time.  The patient has been told to avoid any further NSAID use.  Plan:  Due to the patient's profound anemia the patient will be set up for an EGD for tomorrow.  The patient will be kept n.p.o. after midnight tonight.  The patient has been explained the plan and agrees with it.  Thank you for involving me in the care of this patient.      LOS: 1 day   Dillon Lame, MD  10/21/2019, 3:02 PM Pager [phone] 7am-5pm  Check AMION for 5pm -7am coverage and on weekends   Note: This dictation was prepared with Dragon dictation along with smaller phrase technology. Any transcriptional errors that result from this  process are unintentional.

## 2019-10-21 NOTE — Progress Notes (Signed)
Patient is AOx4.  Upon assessment his lung sounds are clear.  He has good grip and dorsal and plantar flexion.  Peripheral pulses are good. He complains of no pain.  His vitals are HTN SBP 159/96.  PR 99 and RR 25 with SaO2 of 100%. His UOP is very good as he output 734ml this morning around 0730.  Protonix drip still infusing as is the 3rd of 4 units of blood.  I held the morning labs to be acquisition after the completion of his PRBC therapy.

## 2019-10-21 NOTE — Progress Notes (Signed)
Name: Dillon Castillo MRN: [id number] DOB: 18-May-1961    ADMISSION DATE:  10/20/2019 CONSULTATION DATE:  10/20/2019  REFERRING MD :  Dr. Darl Householder  CHIEF COMPLAINT:  Shortness of Breath upon exertion  BRIEF PATIENT DESCRIPTION:  59 year old male admitted 10/20/2019 with symptomatic anemia secondary to acute blood loss anemia in the setting of GI bleed (likely due to NSAID use).  Hemoglobin found to be 2.5, requiring blood transfusions.  Placed on Protonix drip, GI consulted.  SIGNIFICANT EVENTS  2/28-admission ICU 2/28>> GI consulted 2/28-to receive 4 units packed red blood cells  STUDIES:  2/28-chest x-ray>>No acute cardiopulmonary disease.  CULTURES: SARS-CoV-2 PCR 3/1>> negative Influenza PCR 3/1>> negative MRSA PCR 3/1>> negative  ANTIBIOTICS: N/A  No Known Allergies   REVIEW OF SYSTEMS:   10 point review of systems was performed and is as noted on the subjective otherwise, negative  SUBJECTIVE:  Shortness of breath improved after transfusion Denies abdominal pain, N/V, Fever, chills, dizziness, palpitations On room air, comfortable  VITAL SIGNS: Temp:  [97.7 F (36.5 C)-99.1 F (37.3 C)] 98.4 F (36.9 C) (03/01 1823) Pulse Rate:  [91-112] 91 (03/01 1900) Resp:  [13-25] 16 (03/01 1900) BP: (124-169)/(64-96) 157/82 (03/01 1900) SpO2:  [92 %-100 %] 100 % (03/01 1900) Weight:  [74.4 kg] 74.4 kg (03/01 0114)  PHYSICAL EXAMINATION: General:  Acutely ill appearing male, laying on bed, on room air, in NAD Neuro:  Awake, A&Ox4, follows commands, no focal deficits, speech clear HEENT:  Atraumatic, normocephalic, neck supple, no JVD, pupils PERRLA Cardiovascular: Regular rate and rhythm, s1s2, no M/R/G, 2+ pulses throughout Lungs:  Clear to auscultation bilaterally, even, nonlabored, normal effort Abdomen:  Soft, nontender, nondistended, no guarding or rebound tenderness, BS+ x4 Musculoskeletal:  Normal bulk and tone, no deformities, 1+ edema BLE Skin:  Warm and dry,  pale.  No obvious rashes, lesions, or ulcerations.  Recent Labs  Lab 10/20/19 1921 10/21/19 0435  NA 132* 138  K 3.8 4.2  CL 103 110  CO2 22 22  BUN 12 10  CREATININE 0.76 0.76  GLUCOSE 110* 98   Recent Labs  Lab 10/20/19 2020 10/21/19 0435 10/21/19 1509  HGB 2.5* 4.1* 6.6*  HCT 10.4* 14.9* 22.3*  WBC 8.4 5.9 7.1  PLT 222 181 175   DG Chest 2 View  Result Date: 10/20/2019 CLINICAL DATA:  Shortness of breath mild exertion worse on Friday EXAM: CHEST - 2 VIEW COMPARISON:  04/17/2016 FINDINGS: Heart size is stable. Hilar structures are unremarkable. Lungs are clear. No signs of consolidation or pleural effusion. Visualized skeletal structures are unremarkable. IMPRESSION: No acute cardiopulmonary disease. Electronically Signed   By: Zetta Bills M.D.   On: 10/20/2019 19:07    ASSESSMENT / PLAN:  Symptomatic anemia due to acute Blood loss anemia in setting of GI Bleed (likely due to NSAID use) -Monitor for S/Sx of bleeding -Trend CBC (H&H q6h) -SCD's for VTE Prophylaxis  -Transfuse for Hgb <8 -Will require additional transfusion today as hemoglobin remains below 8 -Will not get EGD until tomorrow, diet ordered, n.p.o. after midnight -Continue Protonix  drip -GI consulted, EGD in the a.m.     Disposition:SDU Goals of care: Full code VTE prophylaxis: SCDs (no chemical prophylaxis due to GI bleed) Updates: Updated patient at bedside 10/21/2019   C. Derrill Kay, MD [facility] 10/21/2019, 9:35 PM   *This note was dictated using voice recognition software/Dragon.  Despite best efforts to proofread, errors can occur which can change the meaning.  Any change was  purely unintentional.

## 2019-10-22 ENCOUNTER — Inpatient Hospital Stay: Payer: Self-pay | Admitting: Anesthesiology

## 2019-10-22 ENCOUNTER — Encounter: Payer: Self-pay | Admitting: Pulmonary Disease

## 2019-10-22 ENCOUNTER — Encounter
Admission: EM | Disposition: A | Discharge status: Home / Self Care | Payer: Self-pay | Source: Home / Self Care | Attending: Pulmonary Disease

## 2019-10-22 HISTORY — PX: ESOPHAGOGASTRODUODENOSCOPY (EGD) WITH PROPOFOL: SHX5813

## 2019-10-22 LAB — HEMOGLOBIN AND HEMATOCRIT, BLOOD
HCT: 23.9 % — ABNORMAL LOW (ref 39.0–52.0)
HCT: 27 % — ABNORMAL LOW (ref 39.0–52.0)
HCT: 27.1 % — ABNORMAL LOW (ref 39.0–52.0)
HCT: 27.7 % — ABNORMAL LOW (ref 39.0–52.0)
HCT: 28.4 % — ABNORMAL LOW (ref 39.0–52.0)
Hemoglobin: 7.2 g/dL — ABNORMAL LOW (ref 13.0–17.0)
Hemoglobin: 8.3 g/dL — ABNORMAL LOW (ref 13.0–17.0)
Hemoglobin: 8.3 g/dL — ABNORMAL LOW (ref 13.0–17.0)
Hemoglobin: 8.5 g/dL — ABNORMAL LOW (ref 13.0–17.0)
Hemoglobin: 8.6 g/dL — ABNORMAL LOW (ref 13.0–17.0)

## 2019-10-22 LAB — RENAL FUNCTION PANEL
Albumin: 3.2 g/dL — ABNORMAL LOW (ref 3.5–5.0)
Anion gap: 8 (ref 5–15)
BUN: 11 mg/dL (ref 6–20)
CO2: 24 mmol/L (ref 22–32)
Calcium: 8.3 mg/dL — ABNORMAL LOW (ref 8.9–10.3)
Chloride: 111 mmol/L (ref 98–111)
Creatinine, Ser: 0.81 mg/dL (ref 0.61–1.24)
GFR calc Af Amer: >60 mL/min (ref >60)
GFR calc non Af Amer: >60 mL/min (ref >60)
Glucose, Bld: 97 mg/dL (ref 70–99)
Phosphorus: 3.7 mg/dL (ref 2.5–4.6)
Potassium: 3.8 mmol/L (ref 3.5–5.1)
Sodium: 143 mmol/L (ref 135–145)

## 2019-10-22 LAB — PREPARE RBC (CROSSMATCH)

## 2019-10-22 LAB — MAGNESIUM: Magnesium: 2.3 mg/dL (ref 1.7–2.4)

## 2019-10-22 SURGERY — ESOPHAGOGASTRODUODENOSCOPY (EGD) WITH PROPOFOL
Anesthesia: General

## 2019-10-22 MED ORDER — SODIUM CHLORIDE 0.9 % IV SOLN: INTRAVENOUS | Status: DC | PRN

## 2019-10-22 MED ORDER — SODIUM CHLORIDE 0.9% IV SOLUTION: Freq: Once | INTRAVENOUS | Status: AC

## 2019-10-22 MED ORDER — GLYCOPYRROLATE 0.2 MG/ML IJ SOLN
INTRAMUSCULAR | Status: DC | PRN
  Administered 2019-10-22: .2 mg via INTRAVENOUS

## 2019-10-22 MED ORDER — PROPOFOL 500 MG/50ML IV EMUL
INTRAVENOUS | Status: DC | PRN
  Administered 2019-10-22: 140 ug/kg/min via INTRAVENOUS

## 2019-10-22 MED ORDER — SODIUM CHLORIDE 0.9 % IV SOLN
8.0000 mg/h | INTRAVENOUS | Status: DC
  Administered 2019-10-22: 8 mg/h via INTRAVENOUS
  Filled 2019-10-22: qty 80

## 2019-10-22 MED ORDER — PANTOPRAZOLE SODIUM 40 MG PO TBEC
40.0000 mg | DELAYED_RELEASE_TABLET | Freq: Every day | ORAL | Status: DC
  Administered 2019-10-23: 40 mg via ORAL
  Filled 2019-10-22: qty 1

## 2019-10-22 MED ORDER — SODIUM CHLORIDE 0.9 % IV SOLN: Freq: Once | INTRAVENOUS | Status: AC

## 2019-10-22 MED ORDER — PROPOFOL 10 MG/ML IV BOLUS
INTRAVENOUS | Status: DC | PRN
  Administered 2019-10-22: 20 mg via INTRAVENOUS
  Administered 2019-10-22: 30 mg via INTRAVENOUS
  Administered 2019-10-22: 70 mg via INTRAVENOUS

## 2019-10-22 MED ORDER — LIDOCAINE HCL (CARDIAC) PF 100 MG/5ML IV SOSY
PREFILLED_SYRINGE | INTRAVENOUS | Status: DC | PRN
  Administered 2019-10-22: 100 mg via INTRAVENOUS

## 2019-10-22 NOTE — Progress Notes (Addendum)
PROGRESS NOTE    Dillon Castillo  [id number] DOB: Jul 28, 1961 DOA: 10/20/2019  PCP: Patient, No Pcp Per    LOS - 2   Brief Narrative:  59 y.o. male with history of alcohol abuse presented to the ED on 2/28 with progressive worsening of dyspnea on exertion and reports of dark stools over the past 3 weeks.  Had EGD and colonoscopy in 2017, showed only a small polyp.  Reported he'd been taking a lot of ibuprofen recently for right leg pain, up until 2-3 weeks ago.  In the ED, tachycardic and pale, normotensive and afebrile.  Hemoglobin 2.5.  Transfusion of 4 units packed red cells initially.  Admitted to ICU initially.  GI consulted for evaluation of symptomatic anemia.   Transferred to [facility] service as of 3/2.  EGD today did not identify any source of bleeding.  Subjective 3/2: Patient seen this AM.  No acute events reported.  He denies seeing any further dark/bloody stools.  No abdominal pain, nausea or vomiting.  No fevers chills chest pain or other acute complaints at this time.  He asks if he will be able to eat after his procedure today.  Assessment & Plan:   Active Problems:   GI bleed  Symptomatic anemia due to GI bleeding, undetermined source --GI following --Absolutely no ibuprofen, naproxen or other NSAIDs --Repeat EGD in 2 months --Resume regular diet --Trend H&H another 24 hours and if stable can discharge --Transfuse if hemoglobin < 7 --Stop Protonix drip --Start oral Protonix 40 mg daily --Stable for transfer to MedSurg  History of alcohol abuse -without signs of withdrawal at this time --Recommend cessation of alcohol use   DVT prophylaxis: SCDs   Code Status: Full Code  Family Communication: None at bedside  Disposition Plan: Expect discharge home tomorrow 3/3 if hemoglobin stable and no further bleeding Coming From home Exp DC Date 3/3 Barriers trend hemoglobin overnight Medically Stable for Discharge?  No  Consultants:   Gastroenterology  Procedures:    EGD 3/3  Antimicrobials:   None   Objective: Vitals:   10/22/19 1019 10/22/19 1221 10/22/19 1300 10/22/19 1400  BP: (!) 162/85 (!) 142/84 (!) 142/67 140/79  Pulse:  91 90 99  Resp:  17 16 17   Temp:      TempSrc:      SpO2:  98% 97% 93%  Weight:      Height:        Intake/Output Summary (Last 24 hours) at 10/22/2019 1520 Last data filed at 10/22/2019 1001 Gross per 24 hour  Intake 1622.59 ml  Output 1650 ml  Net -27.41 ml   Filed Weights   10/20/19 1826 10/21/19 0114 10/22/19 0922  Weight: 74.4 kg 74.4 kg 74.4 kg    Examination:  General exam: awake, alert, no acute distress HEENT: moist mucus membranes, hearing grossly normal  Respiratory system: CTAB, no wheezes, rales or rhonchi, normal respiratory effort. Cardiovascular system: normal S1/S2, RRR, no JVD, murmurs, rubs, gallops, no pedal edema.   Gastrointestinal system: soft, NT, ND, no HSM felt, +bowel sounds. Central nervous system: A&O x4. no gross focal neurologic deficits, normal speech Extremities: moves all, no edema, normal tone Skin: dry, intact, normal temperature Psychiatry: normal mood, congruent affect, judgement and insight appear normal    Data Reviewed: I have personally reviewed following labs and imaging studies  CBC: Recent Labs  Lab 10/20/19 2020 10/20/19 2020 10/21/19 0435 10/21/19 1509 10/21/19 2354 10/22/19 0609 10/22/19 1124  WBC 8.4  --  5.9  7.1  --   --   --   NEUTROABS  --   --   --  5.1  --   --   --   HGB 2.5*   < > 4.1* 6.6* 7.2* 8.3* 8.5*  HCT 10.4*   < > 14.9* 22.3* 23.9* 27.0* 27.7*  MCV 67.1*  --  76.0* 77.7*  --   --   --   PLT 222  --  181 175  --   --   --    < > = values in this interval not displayed.   Basic Metabolic Panel: Recent Labs  Lab 10/20/19 1921 10/21/19 0435 10/22/19 0609  NA 132* 138 143  K 3.8 4.2 3.8  CL 103 110 111  CO2 22 22 24   GLUCOSE 110* 98 97  BUN 12 10 11   CREATININE 0.76 0.76 0.81  CALCIUM 7.9* 7.9* 8.3*  MG  --   --  2.3    PHOS  --   --  3.7   GFR: Estimated Creatinine Clearance: 99.4 mL/min (by C-G formula based on SCr of 0.81 mg/dL). Liver Function Tests: Recent Labs  Lab 10/22/19 0609  ALBUMIN 3.2*   No results for input(s): LIPASE, AMYLASE in the last 168 hours. No results for input(s): AMMONIA in the last 168 hours. Coagulation Profile: Recent Labs  Lab 10/20/19 2150  INR 1.2   Cardiac Enzymes: No results for input(s): CKTOTAL, CKMB, CKMBINDEX, TROPONINI in the last 168 hours. BNP (last 3 results) No results for input(s): PROBNP in the last 8760 hours. HbA1C: No results for input(s): HGBA1C in the last 72 hours. CBG: Recent Labs  Lab 10/20/19 2335  GLUCAP 110*   Lipid Profile: No results for input(s): CHOL, HDL, LDLCALC, TRIG, CHOLHDL, LDLDIRECT in the last 72 hours. Thyroid Function Tests: No results for input(s): TSH, T4TOTAL, FREET4, T3FREE, THYROIDAB in the last 72 hours. Anemia Panel: Recent Labs    10/20/19 1921 10/20/19 2150  VITAMINB12  --  552  FOLATE 13.2  --   FERRITIN 2*  --   TIBC 512*  --   IRON 8*  --   RETICCTPCT 4.2*  --    Sepsis Labs: No results for input(s): PROCALCITON, LATICACIDVEN in the last 168 hours.  Recent Results (from the past 240 hour(s))  Respiratory Panel by RT PCR (Flu A&B, Covid) - Nasal Mucosa     Status: None   Collection Time: 10/21/19 12:22 AM   Specimen: Nasal Mucosa  Result Value Ref Range Status   SARS Coronavirus 2 by RT PCR NEGATIVE NEGATIVE Final    Comment: (NOTE) SARS-CoV-2 target nucleic acids are NOT DETECTED. The SARS-CoV-2 RNA is generally detectable in upper respiratoy specimens during the acute phase of infection. The lowest concentration of SARS-CoV-2 viral copies this assay can detect is 131 copies/mL. A negative result does not preclude SARS-Cov-2 infection and should not be used as the sole basis for treatment or other patient management decisions. A negative result may occur with  improper specimen  collection/handling, submission of specimen other than nasopharyngeal swab, presence of viral mutation(s) within the areas targeted by this assay, and inadequate number of viral copies (<131 copies/mL). A negative result must be combined with clinical observations, patient history, and epidemiological information. The expected result is Negative. Fact Sheet for Patients:  PinkCheek.be Fact Sheet for Healthcare Providers:  GravelBags.it This test is not yet ap proved or cleared by the [place] FDA and  has been authorized for detection and/or  diagnosis of SARS-CoV-2 by FDA under an Emergency Use Authorization (EUA). This EUA will remain  in effect (meaning this test can be used) for the duration of the COVID-19 declaration under Section 564(b)(1) of the Act, 21 U.S.C. section 360bbb-3(b)(1), unless the authorization is terminated or revoked sooner.    Influenza A by PCR NEGATIVE NEGATIVE Final   Influenza B by PCR NEGATIVE NEGATIVE Final    Comment: (NOTE) The Xpert Xpress SARS-CoV-2/FLU/RSV assay is intended as an aid in  the diagnosis of influenza from Nasopharyngeal swab specimens and  should not be used as a sole basis for treatment. Nasal washings and  aspirates are unacceptable for Xpert Xpress SARS-CoV-2/FLU/RSV  testing. Fact Sheet for Patients: PinkCheek.be Fact Sheet for Healthcare Providers: GravelBags.it This test is not yet approved or cleared by the [place] FDA and  has been authorized for detection and/or diagnosis of SARS-CoV-2 by  FDA under an Emergency Use Authorization (EUA). This EUA will remain  in effect (meaning this test can be used) for the duration of the  Covid-19 declaration under Section 564(b)(1) of the Act, 21  U.S.C. section 360bbb-3(b)(1), unless the authorization is  terminated or revoked. Performed at [facility],  [address] [place], [place] [address]   MRSA PCR Screening     Status: None   Collection Time: 10/21/19 12:22 AM   Specimen: Nasal Mucosa; Nasopharyngeal  Result Value Ref Range Status   MRSA by PCR NEGATIVE NEGATIVE Final    Comment:        The GeneXpert MRSA Assay (FDA approved for NASAL specimens only), is one component of a comprehensive MRSA colonization surveillance program. It is not intended to diagnose MRSA infection nor to guide or monitor treatment for MRSA infections. Performed at [facility], [address] [place], [place] [address]          Radiology Studies: DG Chest 2 View  Result Date: 10/20/2019 CLINICAL DATA:  Shortness of breath mild exertion worse on Friday EXAM: CHEST - 2 VIEW COMPARISON:  04/17/2016 FINDINGS: Heart size is stable. Hilar structures are unremarkable. Lungs are clear. No signs of consolidation or pleural effusion. Visualized skeletal structures are unremarkable. IMPRESSION: No acute cardiopulmonary disease. Electronically Signed   By: Zetta Bills M.D.   On: 10/20/2019 19:07        Scheduled Meds: . Chlorhexidine Gluconate Cloth  6 each Topical Daily  . folic acid  1 mg Oral Daily  . thiamine  100 mg Oral Daily   Continuous Infusions: . pantoprozole (PROTONIX) infusion 8 mg/hr (10/22/19 1104)     LOS: 2 days    Time spent: 30 minutes    Ezekiel Slocumb, DO [facility]   If 7PM-7AM, please contact night-coverage [email or web address] 10/22/2019, 3:20 PM

## 2019-10-22 NOTE — Progress Notes (Signed)
Repeat HGB 7.2, new order to transfuse 1 unit PRBC at 0200. Pt tolerated well. Pt remained NPO on this shift. Assessments as charted. Will CTM.

## 2019-10-22 NOTE — Anesthesia Preprocedure Evaluation (Addendum)
Anesthesia Evaluation  Patient identified by MRN, date of birth, ID band Patient awake    Reviewed: Allergy & Precautions, NPO status , Patient's Chart, lab work & pertinent test results  History of Anesthesia Complications Negative for: history of anesthetic complications  Airway Mallampati: II  TM Distance: >3 FB Neck ROM: Full    Dental  (+) Poor Dentition   Pulmonary neg sleep apnea, neg COPD, Current Smoker and Patient abstained from smoking.,    breath sounds clear to auscultation- rhonchi (-) wheezing      Cardiovascular hypertension, Pt. on medications (-) CAD, (-) Past MI, (-) Cardiac Stents and (-) CABG  Rhythm:Regular Rate:Normal - Systolic murmurs and - Diastolic murmurs    Neuro/Psych neg Seizures negative neurological ROS  negative psych ROS   GI/Hepatic Neg liver ROS, GIB    Endo/Other  negative endocrine ROSneg diabetes  Renal/GU negative Renal ROS     Musculoskeletal negative musculoskeletal ROS (+)   Abdominal (+) - obese,   Peds  Hematology  (+) anemia ,   Anesthesia Other Findings Past Medical History: No date: Gout No date: Heart murmur No date: Hypertension No date: Polysubstance abuse (HCC)     Comment:  a. ongoing tobacco and etoh abuse; b. remote marijuana               abuse in his 20's   Reproductive/Obstetrics                             Anesthesia Physical Anesthesia Plan  ASA: II  Anesthesia Plan: General   Post-op Pain Management:    Induction: Intravenous  PONV Risk Score and Plan: 0 and Propofol infusion  Airway Management Planned: Natural Airway  Additional Equipment:   Intra-op Plan:   Post-operative Plan:   Informed Consent: I have reviewed the patients History and Physical, chart, labs and discussed the procedure including the risks, benefits and alternatives for the proposed anesthesia with the patient or authorized representative  who has indicated his/her understanding and acceptance.     Dental advisory given  Plan Discussed with: CRNA and Anesthesiologist  Anesthesia Plan Comments:        Anesthesia Quick Evaluation

## 2019-10-22 NOTE — Transfer of Care (Signed)
Immediate Anesthesia Transfer of Care Note  Patient: Dillon Castillo  Procedure(s) Performed: ESOPHAGOGASTRODUODENOSCOPY (EGD) WITH PROPOFOL (N/A )  Patient Location: PACU  Anesthesia Type:General  Level of Consciousness: awake and patient cooperative  Airway & Oxygen Therapy: Patient Spontanous Breathing and Patient connected to nasal cannula oxygen  Post-op Assessment: Report given to RN, Post -op Vital signs reviewed and stable and Patient moving all extremities  Post vital signs: Reviewed and stable  Last Vitals:  Vitals Value Taken Time  BP 151/77 10/22/19 1000  Temp    Pulse 102 10/22/19 1001  Resp 24 10/22/19 1001  SpO2 100 % 10/22/19 1001  Vitals shown include unvalidated device data.  Last Pain:  Vitals:   10/22/19 0922  TempSrc: Temporal  PainSc: 0-No pain         Complications: No apparent anesthesia complications

## 2019-10-22 NOTE — Op Note (Signed)
[facility] Gastroenterology Patient Name: Dillon Castillo Procedure Date: 10/22/2019 9:42 AM MRN: [id number] Account #: [id number] Date of Birth: 07-03-1961 Admit Type: Inpatient Age: 59 Room: [facility] ENDO ROOM 4 Gender: Male Note Status: Finalized Procedure:             Upper GI endoscopy Indications:           Acute post hemorrhagic anemia Providers:             [name] MD, MD Medicines:             Propofol per Anesthesia Complications:         No immediate complications. Procedure:             Pre-Anesthesia Assessment:                        - Prior to the procedure, a History and Physical was                         performed, and patient medications and allergies were                         reviewed. The patient's tolerance of previous                         anesthesia was also reviewed. The risks and benefits                         of the procedure and the sedation options and risks                         were discussed with the patient. All questions were                         answered, and informed consent was obtained. Prior                         Anticoagulants: The patient has taken no previous                         anticoagulant or antiplatelet agents. ASA Grade                         Assessment: II - A patient with mild systemic disease.                         After reviewing the risks and benefits, the patient                         was deemed in satisfactory condition to undergo the                         procedure.                        After obtaining informed consent, the endoscope was                         passed under direct vision. Throughout the procedure,  the patient's blood pressure, pulse, and oxygen                         saturations were monitored continuously. The Endoscope                         was introduced through the mouth, and advanced to the                         second part of  duodenum. The upper GI endoscopy was                         accomplished without difficulty. The patient tolerated                         the procedure well. Findings:      The examined esophagus was normal.      Moderate portal hypertensive gastropathy was found in the entire       examined stomach.      Diffuse granular mucosa was found in the duodenal bulb. Impression:            - Normal esophagus.                        - Portal hypertensive gastropathy.                        - Granular mucosa in the duodenal bulb.                        - No specimens collected. Recommendation:        - Return patient to hospital ward for ongoing care.                        - Resume regular diet.                        - Continue present medications.                        - No ibuprofen, naproxen, or other non-steroidal                         anti-inflammatory drugs.                        - Repeat upper endoscopy in 2 months. Procedure Code(s):     --- Professional ---                        [phone], Esophagogastroduodenoscopy, flexible,                         transoral; diagnostic, including collection of                         specimen(s) by brushing or washing, when performed                         (separate procedure) Diagnosis Code(s):     --- Professional ---  D62, Acute posthemorrhagic anemia                        K76.6, Portal hypertension CPT copyright 2019 American Medical Association. All rights reserved. The codes documented in this report are preliminary and upon coder review may  be revised to meet current compliance requirements. [name] MD, MD 10/22/2019 10:00:43 AM This report has been signed electronically. Number of Addenda: 0 Note Initiated On: 10/22/2019 9:42 AM Estimated Blood Loss:  Estimated blood loss: none.      [facility]

## 2019-10-22 NOTE — Anesthesia Postprocedure Evaluation (Signed)
Anesthesia Post Note  Patient: Dillon Castillo  Procedure(s) Performed: ESOPHAGOGASTRODUODENOSCOPY (EGD) WITH PROPOFOL (N/A )  Patient location during evaluation: Endoscopy Anesthesia Type: General Level of consciousness: awake and alert and oriented Pain management: pain level controlled Vital Signs Assessment: post-procedure vital signs reviewed and stable Respiratory status: spontaneous breathing, nonlabored ventilation and respiratory function stable Cardiovascular status: blood pressure returned to baseline and stable Postop Assessment: no signs of nausea or vomiting Anesthetic complications: no     Last Vitals:  Vitals:   10/22/19 1019 10/22/19 1221  BP: (!) 162/85 (!) 142/84  Pulse:  91  Resp:  17  Temp:    SpO2:  98%    Last Pain:  Vitals:   10/22/19 1019  TempSrc:   PainSc: 0-No pain                 Cherly Erno

## 2019-10-23 ENCOUNTER — Encounter: Payer: Self-pay | Admitting: *Deleted

## 2019-10-23 DIAGNOSIS — Z87898 Personal history of other specified conditions: Secondary | ICD-10-CM

## 2019-10-23 LAB — TYPE AND SCREEN
ABO/RH(D): A POS
Antibody Screen: NEGATIVE
Unit division: 0
Unit division: 0
Unit division: 0
Unit division: 0
Unit division: 0
Unit division: 0

## 2019-10-23 LAB — CBC
HCT: 27.8 % — ABNORMAL LOW (ref 39.0–52.0)
Hemoglobin: 8.2 g/dL — ABNORMAL LOW (ref 13.0–17.0)
MCH: 24 pg — ABNORMAL LOW (ref 26.0–34.0)
MCHC: 29.5 g/dL — ABNORMAL LOW (ref 30.0–36.0)
MCV: 81.5 fL (ref 80.0–100.0)
Platelets: 164 10*3/uL (ref 150–400)
RBC: 3.41 MIL/uL — ABNORMAL LOW (ref 4.22–5.81)
RDW: 21.9 % — ABNORMAL HIGH (ref 11.5–15.5)
WBC: 8.4 10*3/uL (ref 4.0–10.5)
nRBC: 1.1 % — ABNORMAL HIGH (ref 0.0–0.2)

## 2019-10-23 LAB — BPAM RBC
Blood Product Expiration Date: 202103052359
Blood Product Expiration Date: 202103062359
Blood Product Expiration Date: 202103242359
Blood Product Expiration Date: 202103262359
Blood Product Expiration Date: 202103262359
Blood Product Expiration Date: 202103272359
ISSUE DATE / TIME: 202102282336
ISSUE DATE / TIME: 202103010207
ISSUE DATE / TIME: 202103010614
ISSUE DATE / TIME: 202103010949
ISSUE DATE / TIME: 202103011758
ISSUE DATE / TIME: 202103020202
Unit Type and Rh: 600
Unit Type and Rh: 600
Unit Type and Rh: 6200
Unit Type and Rh: 6200
Unit Type and Rh: 6200
Unit Type and Rh: 6200

## 2019-10-23 LAB — BASIC METABOLIC PANEL
Anion gap: 3 — ABNORMAL LOW (ref 5–15)
BUN: 12 mg/dL (ref 6–20)
CO2: 27 mmol/L (ref 22–32)
Calcium: 8.2 mg/dL — ABNORMAL LOW (ref 8.9–10.3)
Chloride: 110 mmol/L (ref 98–111)
Creatinine, Ser: 0.83 mg/dL (ref 0.61–1.24)
GFR calc Af Amer: >60 mL/min (ref >60)
GFR calc non Af Amer: >60 mL/min (ref >60)
Glucose, Bld: 100 mg/dL — ABNORMAL HIGH (ref 70–99)
Potassium: 3.4 mmol/L — ABNORMAL LOW (ref 3.5–5.1)
Sodium: 140 mmol/L (ref 135–145)

## 2019-10-23 MED ORDER — POTASSIUM CHLORIDE CRYS ER 20 MEQ PO TBCR
40.0000 meq | EXTENDED_RELEASE_TABLET | Freq: Once | ORAL | Status: AC
  Administered 2019-10-23: 40 meq via ORAL
  Filled 2019-10-23: qty 2

## 2019-10-23 MED ORDER — THIAMINE HCL 100 MG PO TABS: 100.0000 mg | ORAL_TABLET | Freq: Every day | ORAL | 0 refills | Status: DC

## 2019-10-23 MED ORDER — PANTOPRAZOLE SODIUM 40 MG PO TBEC: 40.0000 mg | DELAYED_RELEASE_TABLET | Freq: Every day | ORAL | 0 refills | Status: AC

## 2019-10-23 MED ORDER — FOLIC ACID 1 MG PO TABS: 1.0000 mg | ORAL_TABLET | Freq: Every day | ORAL | 0 refills | Status: AC

## 2019-10-23 NOTE — TOC Initial Note (Signed)
Transition of Care [facility]) - Initial/Assessment Note    Patient Details  Name: Dillon Castillo MRN: [id number] Date of Birth: Sep 17, 1960  Transition of Care [facility]) CM/SW Contact:    Magnus Ivan, LCSW Phone Number: 10/23/2019, 12:57 PM  Clinical Narrative:               CSW met with patient at bedside due to being informed patient has no PCP or insurance. Patient has orders to discharge home today.  CSW provided booklet with information for free/low cost health care options. Provided copy of [facility] application and instructions for patient to use if interested. Also provided Good RX coupon for new prescription. Patient reported he will be able to obtain this medicine and reach out to establish a PCP. Patient denied additional needs at this time. He reported he is going to use Lyft to go home today. CSW signing off.    Expected Discharge Plan: Home/Self Care Barriers to Discharge: Barriers Resolved   Patient Goals and CMS Choice        Expected Discharge Plan and Services Expected Discharge Plan: Home/Self Care       Living arrangements for the past 2 months: Single Family Home Expected Discharge Date: 10/23/19                                    Prior Living Arrangements/Services Living arrangements for the past 2 months: Single Family Home   Patient language and need for interpreter reviewed:: Yes Do you feel safe going back to the place where you live?: Yes            Criminal Activity/Legal Involvement Pertinent to Current Situation/Hospitalization: No - Comment as needed  Activities of Daily Living Home Assistive Devices/Equipment: None ADL Screening (condition at time of admission) Patient's cognitive ability adequate to safely complete daily activities?: Yes Is the patient deaf or have difficulty hearing?: No Does the patient have difficulty seeing, even when wearing glasses/contacts?: No Does the patient have difficulty concentrating,  remembering, or making decisions?: No Patient able to express need for assistance with ADLs?: Yes Does the patient have difficulty dressing or bathing?: No Independently performs ADLs?: Yes (appropriate for developmental age) Does the patient have difficulty walking or climbing stairs?: No Weakness of Legs: None Weakness of Arms/Hands: None  Permission Sought/Granted                  Emotional Assessment Appearance:: Appears stated age Attitude/Demeanor/Rapport: Engaged, Gracious Affect (typically observed): Calm, Appropriate Orientation: : Oriented to Self, Oriented to Place, Oriented to  Time, Oriented to Situation Alcohol / Substance Use: Not Applicable Psych Involvement: No (comment)  Admission diagnosis:  Melena [K92.1] GI bleed [K92.2] Gastrointestinal hemorrhage with melena [K92.1] Symptomatic anemia [D64.9] Patient Active Problem List   Diagnosis Date Noted  . GI bleed 10/20/2019  . Special screening for malignant neoplasms, colon   . Benign neoplasm of transverse colon   . Iron deficiency anemia   . Other diseases of stomach and duodenum   . Symptomatic anemia 04/17/2016  . Chest pain 04/17/2016  . HTN (hypertension) 04/17/2016  . Melena 04/17/2016   PCP:  Patient, No Pcp Per Pharmacy:   [facility] [address] ([address]), [place] [address] Phone: [phone] Fax: [phone]     Social Determinants of Health (SDOH) Interventions    Readmission  Risk Interventions No flowsheet data found.

## 2019-10-23 NOTE — Progress Notes (Signed)
.  jack 

## 2019-10-23 NOTE — Discharge Instructions (Signed)

## 2019-10-23 NOTE — Discharge Summary (Signed)
Dillon Castillo [id number] DOB: 31-Oct-1960 DOA: 10/20/2019  PCP: Patient, No Pcp Per  Admit date: 10/20/2019 Discharge date: 10/23/2019  Admitted From: Home Disposition: Home  Recommendations for Outpatient Follow-up:  1. Follow up with PCP in 1 week 2. Please obtain BMP/CBC in one week 3. Gastroenterology Dr. Allen Norris in 2 weeks  Home Health: No   Discharge Condition:Stable CODE STATUS: Full Diet recommendation: Regular Brief/Interim Summary: Per HPI Dillon Castillo is a 59 year old male with a past medical history of alcohol abuse who presents to [facility] ED on 10/20/2019 with complaints of dyspnea on exertion.  He reports he has been having dark/burgundy stools for the last 3 weeks.  He reports that in the last 2 to 3 days he has been having shortness of breath with minimal exertion.   He was admitted to the ICU initially.  GI was consulted.  He received 4 units of packed red blood cells.  SARS Covid was negative.  Influenza PCR was negative.  Underwent status post EGD with the following findings, portal hypertensive gastropathy, granular mucosa in the duodenal bulb.  He was started on Protonix drip.  His Protonix drip was switched to p.o. Protonix.  He was transferred to [facility] unit.  He was told absolutely no ibuprofen, NSAID or naproxen.  He is supposed to follow-up with GI in 2 weeks and needs a repeat EGD in 2 months.  He has serial H&H overnight which have remained stable.  He was instructed to follow-up with a primary care to have blood work done in a few days.  He has history of alcohol abuse and recommended cessation of alcohol use.  He did not have any signs of withdrawal.  He is stable to be discharged home.  Discharge Diagnoses:  Active Problems:   GI bleed    Discharge Instructions  Discharge Instructions    Call MD for:  difficulty breathing, headache or visual disturbances   Complete by: As directed    Call MD for:  persistant nausea and vomiting   Complete by: As directed     Call MD for:  severe uncontrolled pain   Complete by: As directed    Diet - low sodium heart healthy   Complete by: As directed    Increase activity slowly   Complete by: As directed      Allergies as of 10/23/2019   No Known Allergies     Medication List    TAKE these medications   atenolol 25 MG tablet Commonly known as: TENORMIN Take 1 tablet (25 mg total) by mouth daily.   folic acid 1 MG tablet Commonly known as: FOLVITE Take 1 tablet (1 mg total) by mouth daily. Start taking on: October 24, 2019   pantoprazole 40 MG tablet Commonly known as: PROTONIX Take 1 tablet (40 mg total) by mouth daily. Start taking on: October 24, 2019   thiamine 100 MG tablet Take 1 tablet (100 mg total) by mouth daily. Start taking on: October 24, 2019      Follow-up Information    [name], MD In 2 weeks.   Specialty: Gastroenterology Why: left a meassage for them to call patient Contact information: [place] [address] [phone]          No Known Allergies  Consultations:  GI, critical care   Procedures/Studies: DG Chest 2 View  Result Date: 10/20/2019 CLINICAL DATA:  Shortness of breath mild exertion worse on Friday EXAM: CHEST - 2 VIEW COMPARISON:  04/17/2016 FINDINGS:  Heart size is stable. Hilar structures are unremarkable. Lungs are clear. No signs of consolidation or pleural effusion. Visualized skeletal structures are unremarkable. IMPRESSION: No acute cardiopulmonary disease. Electronically Signed   By: Zetta Bills M.D.   On: 10/20/2019 19:07       Subjective: Patient feeling better.  Denies shortness of breath or chest pain.  Discharge Exam: Vitals:   10/23/19 0435 10/23/19 0516  BP: (!) 156/68 (!) 165/79  Pulse: 84 82  Resp: 18 16  Temp: 99.1 F (37.3 C) 98.5 F (36.9 C)  SpO2: 98% 97%   Vitals:   10/22/19 1824 10/22/19 2141 10/23/19 0435 10/23/19 0516  BP: (!) 157/77 (!) 146/78 (!) 156/68 (!) 165/79  Pulse: 99 91 84 82   Resp: 16 20 18 16   Temp: 98.4 F (36.9 C) 98.5 F (36.9 C) 99.1 F (37.3 C) 98.5 F (36.9 C)  TempSrc: Oral Oral Oral Oral  SpO2: 99% 97% 98% 97%  Weight:      Height:        General: Pt is alert, awake, not in acute distress Cardiovascular: RRR, S1/S2 +, no rubs, no gallops Respiratory: CTA bilaterally, no wheezing, no rhonchi Abdominal: Soft, NT, ND, bowel sounds + Extremities: no edema, no cyanosis    The results of significant diagnostics from this hospitalization (including imaging, microbiology, ancillary and laboratory) are listed below for reference.     Microbiology: Recent Results (from the past 240 hour(s))  Respiratory Panel by RT PCR (Flu A&B, Covid) - Nasal Mucosa     Status: None   Collection Time: 10/21/19 12:22 AM   Specimen: Nasal Mucosa  Result Value Ref Range Status   SARS Coronavirus 2 by RT PCR NEGATIVE NEGATIVE Final    Comment: (NOTE) SARS-CoV-2 target nucleic acids are NOT DETECTED. The SARS-CoV-2 RNA is generally detectable in upper respiratoy specimens during the acute phase of infection. The lowest concentration of SARS-CoV-2 viral copies this assay can detect is 131 copies/mL. A negative result does not preclude SARS-Cov-2 infection and should not be used as the sole basis for treatment or other patient management decisions. A negative result may occur with  improper specimen collection/handling, submission of specimen other than nasopharyngeal swab, presence of viral mutation(s) within the areas targeted by this assay, and inadequate number of viral copies (<131 copies/mL). A negative result must be combined with clinical observations, patient history, and epidemiological information. The expected result is Negative. Fact Sheet for Patients:  PinkCheek.be Fact Sheet for Healthcare Providers:  GravelBags.it This test is not yet ap proved or cleared by the [place] FDA and  has  been authorized for detection and/or diagnosis of SARS-CoV-2 by FDA under an Emergency Use Authorization (EUA). This EUA will remain  in effect (meaning this test can be used) for the duration of the COVID-19 declaration under Section 564(b)(1) of the Act, 21 U.S.C. section 360bbb-3(b)(1), unless the authorization is terminated or revoked sooner.    Influenza A by PCR NEGATIVE NEGATIVE Final   Influenza B by PCR NEGATIVE NEGATIVE Final    Comment: (NOTE) The Xpert Xpress SARS-CoV-2/FLU/RSV assay is intended as an aid in  the diagnosis of influenza from Nasopharyngeal swab specimens and  should not be used as a sole basis for treatment. Nasal washings and  aspirates are unacceptable for Xpert Xpress SARS-CoV-2/FLU/RSV  testing. Fact Sheet for Patients: PinkCheek.be Fact Sheet for Healthcare Providers: GravelBags.it This test is not yet approved or cleared by the [place] and  has been authorized  for detection and/or diagnosis of SARS-CoV-2 by  FDA under an Emergency Use Authorization (EUA). This EUA will remain  in effect (meaning this test can be used) for the duration of the  Covid-19 declaration under Section 564(b)(1) of the Act, 21  U.S.C. section 360bbb-3(b)(1), unless the authorization is  terminated or revoked. Performed at [facility], [address] [place], [place] [address]   MRSA PCR Screening     Status: None   Collection Time: 10/21/19 12:22 AM   Specimen: Nasal Mucosa; Nasopharyngeal  Result Value Ref Range Status   MRSA by PCR NEGATIVE NEGATIVE Final    Comment:        The GeneXpert MRSA Assay (FDA approved for NASAL specimens only), is one component of a comprehensive MRSA colonization surveillance program. It is not intended to diagnose MRSA infection nor to guide or monitor treatment for MRSA infections. Performed at [facility], [address] [place], [place]  [address]      Labs: BNP (last 3 results) No results for input(s): BNP in the last 8760 hours. Basic Metabolic Panel: Recent Labs  Lab 10/20/19 1921 10/21/19 0435 10/22/19 0609 10/23/19 0508  NA 132* 138 143 140  K 3.8 4.2 3.8 3.4*  CL 103 110 111 110  CO2 22 22 24 27   GLUCOSE 110* 98 97 100*  BUN 12 10 11 12   CREATININE 0.76 0.76 0.81 0.83  CALCIUM 7.9* 7.9* 8.3* 8.2*  MG  --   --  2.3  --   PHOS  --   --  3.7  --    Liver Function Tests: Recent Labs  Lab 10/22/19 0609  ALBUMIN 3.2*   No results for input(s): LIPASE, AMYLASE in the last 168 hours. No results for input(s): AMMONIA in the last 168 hours. CBC: Recent Labs  Lab 10/20/19 2020 10/20/19 2020 10/21/19 0435 10/21/19 0435 10/21/19 1509 10/21/19 2354 10/22/19 0609 10/22/19 1124 10/22/19 1736 10/22/19 2314 10/23/19 0508  WBC 8.4  --  5.9  --  7.1  --   --   --   --   --  8.4  NEUTROABS  --   --   --   --  5.1  --   --   --   --   --   --   HGB 2.5*   < > 4.1*   < > 6.6*   < > 8.3* 8.5* 8.6* 8.3* 8.2*  HCT 10.4*   < > 14.9*   < > 22.3*   < > 27.0* 27.7* 28.4* 27.1* 27.8*  MCV 67.1*  --  76.0*  --  77.7*  --   --   --   --   --  81.5  PLT 222  --  181  --  175  --   --   --   --   --  164   < > = values in this interval not displayed.   Cardiac Enzymes: No results for input(s): CKTOTAL, CKMB, CKMBINDEX, TROPONINI in the last 168 hours. BNP: Invalid input(s): POCBNP CBG: Recent Labs  Lab 10/20/19 2335  GLUCAP 110*   D-Dimer No results for input(s): DDIMER in the last 72 hours. Hgb A1c No results for input(s): HGBA1C in the last 72 hours. Lipid Profile No results for input(s): CHOL, HDL, LDLCALC, TRIG, CHOLHDL, LDLDIRECT in the last 72 hours. Thyroid function studies No results for input(s): TSH, T4TOTAL, T3FREE, THYROIDAB in the last 72 hours.  Invalid input(s): FREET3 Anemia  work up [employer]    10/20/19 1921 10/20/19 2150  VITAMINB12  --  552  FOLATE 13.2  --   FERRITIN 2*  --    TIBC 512*  --   IRON 8*  --   RETICCTPCT 4.2*  --    Urinalysis No results found for: COLORURINE, APPEARANCEUR, LABSPEC, PHURINE, GLUCOSEU, HGBUR, BILIRUBINUR, KETONESUR, PROTEINUR, UROBILINOGEN, NITRITE, LEUKOCYTESUR Sepsis Labs Invalid input(s): PROCALCITONIN,  WBC,  LACTICIDVEN Microbiology Recent Results (from the past 240 hour(s))  Respiratory Panel by RT PCR (Flu A&B, Covid) - Nasal Mucosa     Status: None   Collection Time: 10/21/19 12:22 AM   Specimen: Nasal Mucosa  Result Value Ref Range Status   SARS Coronavirus 2 by RT PCR NEGATIVE NEGATIVE Final    Comment: (NOTE) SARS-CoV-2 target nucleic acids are NOT DETECTED. The SARS-CoV-2 RNA is generally detectable in upper respiratoy specimens during the acute phase of infection. The lowest concentration of SARS-CoV-2 viral copies this assay can detect is 131 copies/mL. A negative result does not preclude SARS-Cov-2 infection and should not be used as the sole basis for treatment or other patient management decisions. A negative result may occur with  improper specimen collection/handling, submission of specimen other than nasopharyngeal swab, presence of viral mutation(s) within the areas targeted by this assay, and inadequate number of viral copies (<131 copies/mL). A negative result must be combined with clinical observations, patient history, and epidemiological information. The expected result is Negative. Fact Sheet for Patients:  PinkCheek.be Fact Sheet for Healthcare Providers:  GravelBags.it This test is not yet ap proved or cleared by the [place] FDA and  has been authorized for detection and/or diagnosis of SARS-CoV-2 by FDA under an Emergency Use Authorization (EUA). This EUA will remain  in effect (meaning this test can be used) for the duration of the COVID-19 declaration under Section 564(b)(1) of the Act, 21 U.S.C. section 360bbb-3(b)(1), unless  the authorization is terminated or revoked sooner.    Influenza A by PCR NEGATIVE NEGATIVE Final   Influenza B by PCR NEGATIVE NEGATIVE Final    Comment: (NOTE) The Xpert Xpress SARS-CoV-2/FLU/RSV assay is intended as an aid in  the diagnosis of influenza from Nasopharyngeal swab specimens and  should not be used as a sole basis for treatment. Nasal washings and  aspirates are unacceptable for Xpert Xpress SARS-CoV-2/FLU/RSV  testing. Fact Sheet for Patients: PinkCheek.be Fact Sheet for Healthcare Providers: GravelBags.it This test is not yet approved or cleared by the [place] FDA and  has been authorized for detection and/or diagnosis of SARS-CoV-2 by  FDA under an Emergency Use Authorization (EUA). This EUA will remain  in effect (meaning this test can be used) for the duration of the  Covid-19 declaration under Section 564(b)(1) of the Act, 21  U.S.C. section 360bbb-3(b)(1), unless the authorization is  terminated or revoked. Performed at [facility], [address] [place], [place] [address]   MRSA PCR Screening     Status: None   Collection Time: 10/21/19 12:22 AM   Specimen: Nasal Mucosa; Nasopharyngeal  Result Value Ref Range Status   MRSA by PCR NEGATIVE NEGATIVE Final    Comment:        The GeneXpert MRSA Assay (FDA approved for NASAL specimens only), is one component of a comprehensive MRSA colonization surveillance program. It is not intended to diagnose MRSA infection nor to guide or monitor treatment for MRSA infections. Performed at [facility], [address] [place], [place] [address]  Time coordinating discharge: Over 30 minutes  SIGNED:   Nolberto Hanlon, MD  [facility] 10/23/2019, 6:22 PM Pager   If 7PM-7AM, please contact night-coverage [email or web address] Password [email or web address]

## 2022-07-10 ENCOUNTER — Encounter
Admission: EM | Disposition: A | Discharge status: Home / Self Care | Payer: Self-pay | Source: Home / Self Care | Attending: Cardiology

## 2022-07-10 ENCOUNTER — Inpatient Hospital Stay: Payer: Self-pay

## 2022-07-10 ENCOUNTER — Inpatient Hospital Stay
Admission: EM | Admit: 2022-07-10 | Discharge: 2022-07-11 | DRG: 287 | Disposition: A | Discharge status: Home / Self Care | Payer: Self-pay | Attending: Internal Medicine | Admitting: Internal Medicine

## 2022-07-10 ENCOUNTER — Inpatient Hospital Stay (HOSPITAL_COMMUNITY)
Admit: 2022-07-10 | Discharge: 2022-07-10 | Disposition: A | Discharge status: Home / Self Care | Payer: Self-pay | Attending: Cardiology | Admitting: Cardiology

## 2022-07-10 ENCOUNTER — Other Ambulatory Visit: Payer: Self-pay

## 2022-07-10 ENCOUNTER — Encounter: Payer: Self-pay | Admitting: Cardiology

## 2022-07-10 DIAGNOSIS — Z720 Tobacco use: Secondary | ICD-10-CM | POA: Insufficient documentation

## 2022-07-10 DIAGNOSIS — I119 Hypertensive heart disease without heart failure: Secondary | ICD-10-CM

## 2022-07-10 DIAGNOSIS — I161 Hypertensive emergency: Secondary | ICD-10-CM | POA: Insufficient documentation

## 2022-07-10 DIAGNOSIS — R079 Chest pain, unspecified: Secondary | ICD-10-CM

## 2022-07-10 DIAGNOSIS — Z8249 Family history of ischemic heart disease and other diseases of the circulatory system: Secondary | ICD-10-CM

## 2022-07-10 DIAGNOSIS — Z79899 Other long term (current) drug therapy: Secondary | ICD-10-CM

## 2022-07-10 DIAGNOSIS — I213 ST elevation (STEMI) myocardial infarction of unspecified site: Secondary | ICD-10-CM

## 2022-07-10 DIAGNOSIS — Z8719 Personal history of other diseases of the digestive system: Secondary | ICD-10-CM

## 2022-07-10 DIAGNOSIS — F17210 Nicotine dependence, cigarettes, uncomplicated: Secondary | ICD-10-CM | POA: Diagnosis present

## 2022-07-10 DIAGNOSIS — M109 Gout, unspecified: Secondary | ICD-10-CM | POA: Diagnosis present

## 2022-07-10 DIAGNOSIS — E871 Hypo-osmolality and hyponatremia: Secondary | ICD-10-CM | POA: Diagnosis present

## 2022-07-10 DIAGNOSIS — R9431 Abnormal electrocardiogram [ECG] [EKG]: Secondary | ICD-10-CM | POA: Diagnosis present

## 2022-07-10 DIAGNOSIS — F1010 Alcohol abuse, uncomplicated: Secondary | ICD-10-CM | POA: Insufficient documentation

## 2022-07-10 DIAGNOSIS — K7010 Alcoholic hepatitis without ascites: Secondary | ICD-10-CM | POA: Diagnosis present

## 2022-07-10 HISTORY — PX: LEFT HEART CATH AND CORONARY ANGIOGRAPHY: CATH118249

## 2022-07-10 HISTORY — PX: CORONARY/GRAFT ACUTE MI REVASCULARIZATION: CATH118305

## 2022-07-10 LAB — POCT I-STAT 7, (LYTES, BLD GAS, ICA,H+H)
Acid-base deficit: 5 mmol/L — ABNORMAL HIGH (ref 0.0–2.0)
Bicarbonate: 19.8 mmol/L — ABNORMAL LOW (ref 20.0–28.0)
Calcium, Ion: 1.23 mmol/L (ref 1.15–1.40)
HCT: 44 % (ref 39.0–52.0)
Hemoglobin: 15 g/dL (ref 13.0–17.0)
O2 Saturation: 95 %
Potassium: 3.5 mmol/L (ref 3.5–5.1)
Sodium: 127 mmol/L — ABNORMAL LOW (ref 135–145)
TCO2: 21 mmol/L — ABNORMAL LOW (ref 22–32)
pCO2 arterial: 34.9 mmHg (ref 32–48)
pH, Arterial: 7.362 (ref 7.35–7.45)
pO2, Arterial: 78 mmHg — ABNORMAL LOW (ref 83–108)

## 2022-07-10 LAB — LIPID PANEL
Cholesterol: 192 mg/dL (ref 0–200)
HDL: 100 mg/dL (ref >40)
LDL Cholesterol: 83 mg/dL (ref 0–99)
Total CHOL/HDL Ratio: 1.9 RATIO
Triglycerides: 47 mg/dL (ref <150)
VLDL: 9 mg/dL (ref 0–40)

## 2022-07-10 LAB — COMPREHENSIVE METABOLIC PANEL
ALT: 60 U/L — ABNORMAL HIGH (ref 0–44)
AST: 42 U/L — ABNORMAL HIGH (ref 15–41)
Albumin: 4.6 g/dL (ref 3.5–5.0)
Alkaline Phosphatase: 123 U/L (ref 38–126)
Anion gap: 11 (ref 5–15)
BUN: 6 mg/dL — ABNORMAL LOW (ref 8–23)
CO2: 25 mmol/L (ref 22–32)
Calcium: 9.7 mg/dL (ref 8.9–10.3)
Chloride: 91 mmol/L — ABNORMAL LOW (ref 98–111)
Creatinine, Ser: 0.56 mg/dL — ABNORMAL LOW (ref 0.61–1.24)
GFR, Estimated: >60 mL/min (ref >60)
Glucose, Bld: 111 mg/dL — ABNORMAL HIGH (ref 70–99)
Potassium: 4.2 mmol/L (ref 3.5–5.1)
Sodium: 127 mmol/L — ABNORMAL LOW (ref 135–145)
Total Bilirubin: 1.3 mg/dL — ABNORMAL HIGH (ref 0.3–1.2)
Total Protein: 8.8 g/dL — ABNORMAL HIGH (ref 6.5–8.1)

## 2022-07-10 LAB — MRSA NEXT GEN BY PCR, NASAL: MRSA by PCR Next Gen: NOT DETECTED

## 2022-07-10 LAB — CBC WITH DIFFERENTIAL/PLATELET
Abs Immature Granulocytes: 0.03 10*3/uL (ref 0.00–0.07)
Basophils Absolute: 0 10*3/uL (ref 0.0–0.1)
Basophils Relative: 0 %
Eosinophils Absolute: 0 10*3/uL (ref 0.0–0.5)
Eosinophils Relative: 1 %
HCT: 47.9 % (ref 39.0–52.0)
Hemoglobin: 16.5 g/dL (ref 13.0–17.0)
Immature Granulocytes: 0 %
Lymphocytes Relative: 19 %
Lymphs Abs: 1.6 10*3/uL (ref 0.7–4.0)
MCH: 33.8 pg (ref 26.0–34.0)
MCHC: 34.4 g/dL (ref 30.0–36.0)
MCV: 98.2 fL (ref 80.0–100.0)
Monocytes Absolute: 1 10*3/uL (ref 0.1–1.0)
Monocytes Relative: 12 %
Neutro Abs: 5.5 10*3/uL (ref 1.7–7.7)
Neutrophils Relative %: 68 %
Platelets: 150 10*3/uL (ref 150–400)
RBC: 4.88 MIL/uL (ref 4.22–5.81)
RDW: 11.9 % (ref 11.5–15.5)
WBC: 8.1 10*3/uL (ref 4.0–10.5)
nRBC: 0 % (ref 0.0–0.2)

## 2022-07-10 LAB — PROTIME-INR
INR: 1 (ref 0.8–1.2)
Prothrombin Time: 12.9 seconds (ref 11.4–15.2)

## 2022-07-10 LAB — TROPONIN I (HIGH SENSITIVITY)
Troponin I (High Sensitivity): 9 ng/L (ref <18)
Troponin I (High Sensitivity): 12 ng/L (ref <18)
Troponin I (High Sensitivity): 13 ng/L (ref <18)

## 2022-07-10 LAB — APTT: aPTT: 27 seconds (ref 24–36)

## 2022-07-10 LAB — GLUCOSE, CAPILLARY: Glucose-Capillary: 88 mg/dL (ref 70–99)

## 2022-07-10 SURGERY — CORONARY/GRAFT ACUTE MI REVASCULARIZATION
Anesthesia: Moderate Sedation

## 2022-07-10 MED ORDER — CARVEDILOL 6.25 MG PO TABS
6.2500 mg | ORAL_TABLET | Freq: Two times a day (BID) | ORAL | Status: DC
  Administered 2022-07-10 – 2022-07-11 (×2): 6.25 mg via ORAL
  Filled 2022-07-10 (×2): qty 1

## 2022-07-10 MED ORDER — CHLORHEXIDINE GLUCONATE CLOTH 2 % EX PADS: 6.0000 | MEDICATED_PAD | Freq: Every day | CUTANEOUS | Status: DC

## 2022-07-10 MED ORDER — TRAZODONE HCL 50 MG PO TABS
25.0000 mg | ORAL_TABLET | Freq: Every day | ORAL | Status: DC
  Administered 2022-07-10: 25 mg via ORAL
  Filled 2022-07-10: qty 1

## 2022-07-10 MED ORDER — ONDANSETRON HCL 4 MG/2ML IJ SOLN: 4.0000 mg | Freq: Four times a day (QID) | INTRAMUSCULAR | Status: DC | PRN

## 2022-07-10 MED ORDER — VERAPAMIL HCL 2.5 MG/ML IV SOLN
INTRAVENOUS | Status: AC
  Filled 2022-07-10: qty 2

## 2022-07-10 MED ORDER — HEPARIN (PORCINE) IN NACL 1000-0.9 UT/500ML-% IV SOLN
INTRAVENOUS | Status: AC
  Filled 2022-07-10: qty 1000

## 2022-07-10 MED ORDER — ORAL CARE MOUTH RINSE: 15.0000 mL | OROMUCOSAL | Status: DC | PRN

## 2022-07-10 MED ORDER — SODIUM CHLORIDE 0.9% FLUSH: 3.0000 mL | INTRAVENOUS | Status: DC | PRN

## 2022-07-10 MED ORDER — VERAPAMIL HCL 2.5 MG/ML IV SOLN
INTRAVENOUS | Status: DC | PRN
  Administered 2022-07-10: 2.5 mg via INTRAVENOUS

## 2022-07-10 MED ORDER — FENTANYL CITRATE (PF) 100 MCG/2ML IJ SOLN
INTRAMUSCULAR | Status: AC
  Filled 2022-07-10: qty 2

## 2022-07-10 MED ORDER — SODIUM CHLORIDE 0.9% FLUSH
3.0000 mL | Freq: Two times a day (BID) | INTRAVENOUS | Status: DC
  Administered 2022-07-11: 3 mL via INTRAVENOUS

## 2022-07-10 MED ORDER — HEPARIN SODIUM (PORCINE) 1000 UNIT/ML IJ SOLN
INTRAMUSCULAR | Status: AC
  Filled 2022-07-10: qty 10

## 2022-07-10 MED ORDER — LABETALOL HCL 5 MG/ML IV SOLN
10.0000 mg | INTRAVENOUS | Status: AC | PRN
  Administered 2022-07-10 (×2): 10 mg via INTRAVENOUS
  Filled 2022-07-10 (×2): qty 4

## 2022-07-10 MED ORDER — HEPARIN SODIUM (PORCINE) 1000 UNIT/ML IJ SOLN
4000.0000 [IU] | Freq: Once | INTRAMUSCULAR | Status: AC
  Administered 2022-07-10: 4000 [IU] via INTRAVENOUS

## 2022-07-10 MED ORDER — FENTANYL CITRATE (PF) 100 MCG/2ML IJ SOLN
INTRAMUSCULAR | Status: DC | PRN
  Administered 2022-07-10: 50 ug via INTRAVENOUS

## 2022-07-10 MED ORDER — ENOXAPARIN SODIUM 30 MG/0.3ML IJ SOSY
30.0000 mg | PREFILLED_SYRINGE | INTRAMUSCULAR | Status: DC
  Administered 2022-07-11: 30 mg via SUBCUTANEOUS
  Filled 2022-07-10: qty 0.3

## 2022-07-10 MED ORDER — ASPIRIN 81 MG PO CHEW
324.0000 mg | CHEWABLE_TABLET | Freq: Once | ORAL | Status: AC
  Administered 2022-07-10: 324 mg via ORAL

## 2022-07-10 MED ORDER — ACETAMINOPHEN 325 MG PO TABS: 650.0000 mg | ORAL_TABLET | ORAL | Status: DC | PRN

## 2022-07-10 MED ORDER — HEPARIN (PORCINE) IN NACL 1000-0.9 UT/500ML-% IV SOLN
INTRAVENOUS | Status: DC | PRN
  Administered 2022-07-10: 1000 mL

## 2022-07-10 MED ORDER — MIDAZOLAM HCL 2 MG/2ML IJ SOLN
INTRAMUSCULAR | Status: DC | PRN
  Administered 2022-07-10: 1 mg via INTRAVENOUS

## 2022-07-10 MED ORDER — LIDOCAINE HCL 1 % IJ SOLN
INTRAMUSCULAR | Status: AC
  Filled 2022-07-10: qty 20

## 2022-07-10 MED ORDER — METOPROLOL TARTRATE 5 MG/5ML IV SOLN
5.0000 mg | Freq: Once | INTRAVENOUS | Status: AC
  Administered 2022-07-10: 5 mg via INTRAVENOUS

## 2022-07-10 MED ORDER — NITROGLYCERIN 2 % TD OINT
1.0000 [in_us] | TOPICAL_OINTMENT | Freq: Once | TRANSDERMAL | Status: AC
  Administered 2022-07-10: 1 [in_us] via TOPICAL

## 2022-07-10 MED ORDER — SODIUM CHLORIDE 0.9 % IV SOLN: INTRAVENOUS | Status: DC

## 2022-07-10 MED ORDER — SODIUM CHLORIDE 0.9 % IV SOLN: 250.0000 mL | INTRAVENOUS | Status: DC | PRN

## 2022-07-10 MED ORDER — HEPARIN SODIUM (PORCINE) 1000 UNIT/ML IJ SOLN: INTRAMUSCULAR | Status: DC | PRN

## 2022-07-10 MED ORDER — HYDRALAZINE HCL 20 MG/ML IJ SOLN: 10.0000 mg | INTRAMUSCULAR | Status: AC | PRN

## 2022-07-10 MED ORDER — MIDAZOLAM HCL 2 MG/2ML IJ SOLN
INTRAMUSCULAR | Status: AC
  Filled 2022-07-10: qty 2

## 2022-07-10 MED ORDER — SODIUM CHLORIDE 0.9 % WEIGHT BASED INFUSION: 1.0000 mL/kg/h | INTRAVENOUS | Status: AC

## 2022-07-10 SURGICAL SUPPLY — 11 items
CATH 5F 110X4 TIG (CATHETERS) IMPLANT
CATH INFINITI 5FR ANG PIGTAIL (CATHETERS) IMPLANT
DEVICE RAD TR BAND REGULAR (VASCULAR PRODUCTS) IMPLANT
GLIDESHEATH SLEND SS 6F .021 (SHEATH) IMPLANT
GUIDEWIRE INQWIRE 1.5J.035X260 (WIRE) IMPLANT
INQWIRE 1.5J .035X260CM (WIRE) ×1
KIT ENCORE 26 ADVANTAGE (KITS) IMPLANT
PACK CARDIAC CATH (CUSTOM PROCEDURE TRAY) ×1 IMPLANT
PROTECTION STATION PRESSURIZED (MISCELLANEOUS) ×1
SET ATX SIMPLICITY (MISCELLANEOUS) IMPLANT
STATION PROTECTION PRESSURIZED (MISCELLANEOUS) IMPLANT

## 2022-07-10 NOTE — Assessment & Plan Note (Addendum)
Encouraged smoking cessation.  Plan Nursing to provide education  Nicotine patch-declined by patient  Educated as to cessation strategies.

## 2022-07-10 NOTE — Progress Notes (Signed)
1806-Pt arrival from cath lab, and placed on monitor. Full assessment complete, see flow sheets. CHG bath complete on arrival. Pt given call light and is resting comfortably in bed. 1905-6m removed from band, no signs of bleeding present.

## 2022-07-10 NOTE — Subjective & Objective (Signed)
Dillon Castillo, a 61 y/o with h/o GI bleed, HTN on no medication, polysubstance abuse awoe in the night with chest discomfort. This was unrelenting so he presents to [facility]ED at approximately 1600hrs for evaluation.

## 2022-07-10 NOTE — H&P (Signed)
History and Physical    Dillon Castillo XNA:[id number] DOB: [birth year] DOA: 07/10/2022  DOS: the patient was seen and examined on 07/10/2022  PCP: Patient, No Pcp Per   Patient coming from: Home  I have personally briefly reviewed patient's old medical records in [place]  Dillon Castillo, a 61 y/o with h/o GI bleed, HTN on no medication, polysubstance abuse awoe in the night with chest discomfort. This was unrelenting so he presents to [facility]ED at approximately 1600hrs for evaluation.    ED Course: T 99  SBP> 200  HR 89 RR 21. Patient initially presented with chest pain which was resolved after a short period of time.  Toponin x2 negative. EKG with minimal ST elevtion V3-V5 read as acute STEMI. Code STEMI initiated. Cardiology saw patient, took to lab for left heart cath. TRH called to bring in on observation overnight.  Review of Systems:  Review of Systems  Constitutional:  Negative for chills, fever and weight loss.  HENT: Negative.    Eyes: Negative.   Respiratory: Negative.    Cardiovascular:  Positive for chest pain. Negative for orthopnea, leg swelling and PND.  Gastrointestinal:  Positive for heartburn. Negative for abdominal pain.  Genitourinary: Negative.   Musculoskeletal: Negative.   Skin: Negative.   Neurological: Negative.   Endo/Heme/Allergies: Negative.   Psychiatric/Behavioral: Negative.      Past Medical History:  Diagnosis Date   Gout    Heart murmur    Hypertension    Polysubstance abuse ([place])    a. ongoing tobacco and etoh abuse; b. remote marijuana abuse in his 20's    Past Surgical History:  Procedure Laterality Date   APPENDECTOMY     COLONOSCOPY WITH PROPOFOL N/A 06/21/2016   Procedure: COLONOSCOPY WITH PROPOFOL;  Surgeon: [name], MD;  Location: [facility] ENDOSCOPY;  Service: Endoscopy;  Laterality: N/A;   ESOPHAGOGASTRODUODENOSCOPY (EGD) WITH PROPOFOL N/A 04/19/2016   Procedure: ESOPHAGOGASTRODUODENOSCOPY (EGD) WITH PROPOFOL;  Surgeon:  [name], MD;  Location: [facility] ENDOSCOPY;  Service: Endoscopy;  Laterality: N/A;   ESOPHAGOGASTRODUODENOSCOPY (EGD) WITH PROPOFOL N/A 10/22/2019   Procedure: ESOPHAGOGASTRODUODENOSCOPY (EGD) WITH PROPOFOL;  Surgeon: [name], MD;  Location: [facility] ENDOSCOPY;  Service: Endoscopy;  Laterality: N/A;    Soc Hx - batchelor. Has a large family - many nieces and nephews. Works as a Training and development officer at [employer].    reports that he has been smoking cigarettes. He has a 10.00 pack-year smoking history. He has never used smokeless tobacco. He reports current alcohol use. He reports that he does not use drugs.  No Known Allergies  Family History  Problem Relation Age of Onset   Hypertension Mother    Heart disease Father    Heart attack Brother     Prior to Admission medications   Medication Sig Start Date End Date Taking? Authorizing Provider  atenolol (TENORMIN) 25 MG tablet Take 1 tablet (25 mg total) by mouth daily. Patient not taking: Reported on 10/20/2019 04/19/16   Dustin Flock, MD  folic acid (FOLVITE) 1 MG tablet Take 1 tablet (1 mg total) by mouth daily. 10/24/19   Nolberto Hanlon, MD  pantoprazole (PROTONIX) 40 MG tablet Take 1 tablet (40 mg total) by mouth daily. 10/24/19   Nolberto Hanlon, MD  thiamine 100 MG tablet Take 1 tablet (100 mg total) by mouth daily. 10/24/19   Nolberto Hanlon, MD    Physical Exam: Vitals:   07/10/22 1748 07/10/22 1806 07/10/22 1830 07/10/22 1845  BP: (!) 175/104 (!) 160/104 Marland Kitchen)  157/88 (!) 157/91  Pulse:   87 96  Resp:  (!) '21 20 20  '$ Temp:  99 F (37.2 C)    TempSrc:  Oral    SpO2:  94% 94% 91%  Weight:  61.6 kg      Physical Exam Vitals and nursing note reviewed.  Constitutional:      Appearance: He is well-developed.     Comments: slender  HENT:     Head: Normocephalic and atraumatic.  Eyes:     Extraocular Movements: Extraocular movements intact.     Pupils: Pupils are equal, round, and reactive to light.  Neck:     Thyroid: No thyromegaly.      Vascular: No JVD.  Cardiovascular:     Rate and Rhythm: Regular rhythm. Tachycardia present.     Pulses:          Carotid pulses are 2+ on the right side and 2+ on the left side.      Radial pulses are 2+ on the right side and 2+ on the left side.       Dorsalis pedis pulses are 2+ on the right side and 2+ on the left side.       Posterior tibial pulses are 2+ on the right side and 2+ on the left side.     Heart sounds: Normal heart sounds. No murmur heard. Pulmonary:     Effort: Pulmonary effort is normal. No tachypnea or respiratory distress.     Breath sounds: Examination of the right-lower field reveals rhonchi. Examination of the left-lower field reveals rhonchi. Rhonchi present. No decreased breath sounds or rales.  Abdominal:     General: Bowel sounds are normal.     Palpations: Abdomen is soft.  Musculoskeletal:        General: Normal range of motion.     Cervical back: Normal range of motion and neck supple.     Right lower leg: No tenderness.     Left lower leg: No tenderness.  Skin:    General: Skin is warm and dry.  Neurological:     General: No focal deficit present.     Mental Status: He is alert and oriented to person, place, and time.     Cranial Nerves: No cranial nerve deficit.  Psychiatric:        Mood and Affect: Mood normal.        Behavior: Behavior normal.      Labs on Admission: I have personally reviewed following labs and imaging studies  CBC: Recent Labs  Lab 07/10/22 1615 07/10/22 1731  WBC 8.1  --   NEUTROABS 5.5  --   HGB 16.5 15.0  HCT 47.9 44.0  MCV 98.2  --   PLT 150  --    Basic Metabolic Panel: Recent Labs  Lab 07/10/22 1615 07/10/22 1731  NA 127* 127*  K 4.2 3.5  CL 91*  --   CO2 25  --   GLUCOSE 111*  --   BUN 6*  --   CREATININE 0.56*  --   CALCIUM 9.7  --    GFR: CrCl cannot be calculated (Unknown ideal weight.). Liver Function Tests: Recent Labs  Lab 07/10/22 1615  AST 42*  ALT 60*  ALKPHOS 123  BILITOT 1.3*   PROT 8.8*  ALBUMIN 4.6   No results for input(s): "LIPASE", "AMYLASE" in the last 168 hours. No results for input(s): "AMMONIA" in the last 168 hours. Coagulation Profile: Recent Labs  Lab 07/10/22 1615  INR 1.0   Cardiac Enzymes: No results for input(s): "CKTOTAL", "CKMB", "CKMBINDEX", "TROPONINI" in the last 168 hours. BNP (last 3 results) No results for input(s): "PROBNP" in the last 8760 hours. HbA1C: No results for input(s): "HGBA1C" in the last 72 hours. CBG: Recent Labs  Lab 07/10/22 1807  GLUCAP 88   Lipid Profile: Recent Labs    07/10/22 1615  CHOL 192  HDL 100  LDLCALC 83  TRIG 47  CHOLHDL 1.9   Thyroid Function Tests: No results for input(s): "TSH", "T4TOTAL", "FREET4", "T3FREE", "THYROIDAB" in the last 72 hours. Anemia Panel: No results for input(s): "VITAMINB12", "FOLATE", "FERRITIN", "TIBC", "IRON", "RETICCTPCT" in the last 72 hours. Urine analysis: No results found for: "COLORURINE", "APPEARANCEUR", "LABSPEC", "PHURINE", "GLUCOSEU", "HGBUR", "BILIRUBINUR", "KETONESUR", "PROTEINUR", "UROBILINOGEN", "NITRITE", "LEUKOCYTESUR"  Radiological Exams on Admission: I have personally reviewed images CARDIAC CATHETERIZATION  Result Date: 07/10/2022 Accelerated hypertension with ST elevations on EKG-not consistent with STEMI based on anatomy. Angiographically Minimal CAD - with no evidence of obstructive coronary disease, however there is tortuosity in the coronaries indicating hypertension Hyperdynamic LV with EF of 65% and EDP of 12-13 mmHg despite systemic hypertension. RECOMMENDATIONS Admit for overnight monitoring and TR band removal. Check 2D echocardiogram-would also need to exclude pericarditis as the reason for chest pain. Have started carvedilol 6.25 mg twice daily. FLP and A1c checked for risk factor modification. Smoking cessation counseling.    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  Plan Per Dr. Ellyn Hack - coreg daily  Cardiology followup  Will need PCP  ST elevation - without occlusive CAD In ED T 99, SBP >200, HT 89, RR 21. EKG with minimal ST elevation in V3-V5 read as acute STEMI. Troponins 1, 9. Code STEMI called - patient seen by Dr. Ellyn Hack. Patient taken to lab for left heart cath - negative for occlusive CAD but with evidence of tortuosity. Post-cath Dr. Ellyn Hack started Coreg and ordered Metoprolol and Hydralazine as prn meds for excessive HTN. Dr. Ellyn Hack ordered AM lipid panel, A1C. He has ordered 2D echo for AM. He requests patient be admitted on obs in ICU for overnight observation until echo done. He feels patient can be discharged from cardiology perspective if no untoward events overnight.  Plan ICU admit on observation  Continue Coreg  Continue prn Metoprolol or hydralazine for SBP >180.  ECHO in AM  Hyponatremia At admission Na 127  Plan Patient post cath - hydration with NS at 100 cc/hr  Bmet in AM  Heavy tobacco smoker >10 cigarettes per day Encouraged smoking cessation.  Plan Nursing to provide education  Nicotine patch-declined by patient  Educated as to cessation strategies.        DVT prophylaxis: Lovenox Code Status: Full Code Family Communication: left messasge for contact person Burney Gauze  Disposition Plan: home 24 hrs  Consults called: Cardiology - Dr. Ellyn Hack  Admission status: Observation, Telemetry bed   Adella Hare, MD [facility] 07/10/2022, 7:16 PM

## 2022-07-10 NOTE — ED Triage Notes (Signed)
Pt presents to the ED via POV due to CP that started this morning 0100. Pt states the pain has not gone away. Pt denies cardiac hx. Pt currently having CP. Denies NVD.Pt A&Ox4

## 2022-07-10 NOTE — ED Provider Notes (Signed)
[facility] Provider Note    Event Date/Time   First MD Initiated Contact with Patient 07/10/22 1622     (approximate)   History   Chest Pain   HPI  Dillon Castillo is a 61 y.o. male  who presents to the emergency department today because of concern for chest pain. States that it started when he woke up this morning.  Located in the center part of his chest. Pressure like. Worse with deep breaths. Did have some radiation into his neck although that is no longer present. Has history of high blood pressure although states he is not on any medication. Patient smokes. States he thinks his father had heart disease.   Physical Exam   Triage Vital Signs: ED Triage Vitals  Enc Vitals Group     BP 07/10/22 1622 (!) 203/107     Pulse Rate 07/10/22 1622 (!) 111     Resp 07/10/22 1622 18     Temp 07/10/22 1623 98.7 F (37.1 C)     Temp Source 07/10/22 1623 Oral     SpO2 07/10/22 1622 95 %     Weight --      Height --      Head Circumference --      Peak Flow --      Pain Score --      Pain Loc --      Pain Edu? --      Excl. in [place]? --     Most recent vital signs: Vitals:   07/10/22 1622 07/10/22 1623  BP: (!) 203/107 (!) 203/107  Pulse: (!) 111 (!) 111  Resp: 18 20  Temp:  98.7 F (37.1 C)  SpO2: 95% 95%   General: Awake, alert, oriented. CV:  Good peripheral perfusion. Regular rate and rhythm. Resp:  Normal effort. Lungs clear. Abd:  No distention.     ED Results / Procedures / Treatments   Labs (all labs ordered are listed, but only abnormal results are displayed) Labs Reviewed  CBC WITH DIFFERENTIAL/PLATELET  PROTIME-INR  APTT  COMPREHENSIVE METABOLIC PANEL  LIPID PANEL  TROPONIN I (HIGH SENSITIVITY)     EKG  I, Nance Pear, attending physician, personally viewed and interpreted this EKG  EKG Time: 1616 Rate: 103 Rhythm: sinus tachycardia Axis: normal Intervals: qtc 453 QRS: narrow ST changes: st elevation v2, v3,  v4 Impression: abnormal ekg    RADIOLOGY None   PROCEDURES:  Critical Care performed: Yes, see critical care procedure note(s)  Procedures  CRITICAL CARE Performed by: Nance Pear   Total critical care time: 30 minutes  Critical care time was exclusive of separately billable procedures and treating other patients.  Critical care was necessary to treat or prevent imminent or life-threatening deterioration.  Critical care was time spent personally by me on the following activities: development of treatment plan with patient and/or surrogate as well as nursing, discussions with consultants, evaluation of patient's response to treatment, examination of patient, obtaining history from patient or surrogate, ordering and performing treatments and interventions, ordering and review of laboratory studies, ordering and review of radiographic studies, pulse oximetry and re-evaluation of patient's condition.   MEDICATIONS ORDERED IN ED: Medications  aspirin chewable tablet 324 mg (324 mg Oral Given 07/10/22 1622)  heparin sodium (porcine) injection 4,000 Units (4,000 Units Intravenous Given 07/10/22 1632)     IMPRESSION / MDM / ASSESSMENT AND PLAN / ED COURSE  I reviewed the triage vital signs and the nursing notes.  Differential diagnosis includes, but is not limited to, STEMI, vasospasm  Patient's presentation is most consistent with acute presentation with potential threat to life or bodily function.  Patient presented to the emergency department today because of concerns for chest pain.  EKG was concerning for ST elevation MI.  Code STEMI was called.  Patient was given aspirin and heparin.  Discussed with Dr. Ellyn Hack with cardiology who will come evaluate the patient for cardiac catheterization.   FINAL CLINICAL IMPRESSION(S) / ED DIAGNOSES   Final diagnoses:  ST elevation myocardial infarction (STEMI), unspecified artery ([place])  Chest pain,  unspecified type     Note:  This document was prepared using Dragon voice recognition software and may include unintentional dictation errors.    Nance Pear, MD 07/10/22 [phone]

## 2022-07-10 NOTE — Interval H&P Note (Signed)
History and Physical Interval Note:  07/10/2022 1706  Dillon Castillo  has presented today for surgery, with the diagnosis of (possible) Anterolateral STEMI & Accelerated Hypertension.    The various methods of treatment have been discussed with the patient and family. After consideration of risks, benefits and other options for treatment, the patient has consented to  Procedure(s): Coronary/Graft Acute MI Revascularization (N/A) LEFT HEART CATH AND CORONARY ANGIOGRAPHY (N/A) as a surgical intervention.      The patient's history has been reviewed, patient examined, no change in status, stable for surgery.  I have reviewed the patient's chart and labs.  Questions were answered to the patient's satisfaction.     Glenetta Hew

## 2022-07-10 NOTE — Assessment & Plan Note (Signed)
In ED T 99, SBP >200, HT 89, RR 21. EKG with minimal ST elevation in V3-V5 read as acute STEMI. Troponins 1, 9. Code STEMI called - patient seen by Dr. Ellyn Hack. Patient taken to lab for left heart cath - negative for occlusive CAD but with evidence of tortuosity. Post-cath Dr. Ellyn Hack started Coreg and ordered Metoprolol and Hydralazine as prn meds for excessive HTN. Dr. Ellyn Hack ordered AM lipid panel, A1C. He has ordered 2D echo for AM. He requests patient be admitted on obs in ICU for overnight observation until echo done. He feels patient can be discharged from cardiology perspective if no untoward events overnight.  Plan ICU admit on observation  Continue Coreg  Continue prn Metoprolol or hydralazine for SBP >180.  ECHO in AM

## 2022-07-10 NOTE — Consult Note (Addendum)
Cardiology Consultation   Patient ID: Dillon Castillo MRN: [id number]; DOB: 1961-06-14  Admit date: 07/10/2022 Date of Consult: 07/10/2022  PCP:  Patient, No Pcp Per   [place] Providers Cardiologist:  None        Patient Profile:   Dillon Castillo is a 61 y.o. male with a hx of essential hypertension (not on medications), and history of GI bleed in 2021 who is being seen 07/10/2022 for the evaluation of possible anterior STEMI with accelerated hypertension at the request of Dr. Orma Render EDP.  History of Present Illness:   Dillon Castillo stated that he was in his usual state of health having recovered from his GI bleed 2021.  No major issues.  However this morning at 1 AM 05/10/2022 he started having epigastric and chest pain when he woke up to go to the bathroom.  He was unable to get back to sleep again.  He felt uncomfortable throughout the night.  Pain at worst was maybe 8/10.  When it did not go away by this afternoon roughly 3 PM, he chose to contact EMS.  He was brought to [facility] emergency room-arriving at 1620.  Initial EKG suggested possible ST elevations, but was not overly conclusive per EDP.  Repeat EKGs showed persistent findings and therefore code STEMI was called.  Of note, he is also significant hypertensive with blood pressure in the 200s over 100s with heart rate in the 100 range.Marland Kitchen  Upon my arrival to the ER patient was almost pain-free.  Resting comfortably in bed.  He did receive 4000 with IV heparin.  Remained tachycardic and still hypertensive but not to the same extent.  I ordered 5 mg IV Lopressor and 1 inch NTG paste.  Labs been drawn but none are back yet.   Past Medical History:  Diagnosis Date   Gout    Heart murmur    Hypertension    Polysubstance abuse ([place])    a. ongoing tobacco and etoh abuse; b. remote marijuana abuse in his 20's    Past Surgical History:  Procedure Laterality Date   APPENDECTOMY     COLONOSCOPY WITH PROPOFOL N/A  06/21/2016   Procedure: COLONOSCOPY WITH PROPOFOL;  Surgeon: [name], MD;  Location: [facility] ENDOSCOPY;  Service: Endoscopy;  Laterality: N/A;   ESOPHAGOGASTRODUODENOSCOPY (EGD) WITH PROPOFOL N/A 04/19/2016   Procedure: ESOPHAGOGASTRODUODENOSCOPY (EGD) WITH PROPOFOL;  Surgeon: [name], MD;  Location: [facility] ENDOSCOPY;  Service: Endoscopy;  Laterality: N/A;   ESOPHAGOGASTRODUODENOSCOPY (EGD) WITH PROPOFOL N/A 10/22/2019   Procedure: ESOPHAGOGASTRODUODENOSCOPY (EGD) WITH PROPOFOL;  Surgeon: [name], MD;  Location: [facility] ENDOSCOPY;  Service: Endoscopy;  Laterality: N/A;     Home Medications: Actually not taking any medications. Prior to Admission medications   Medication Sig Start Date End Date Taking? Authorizing Provider  atenolol (TENORMIN) 25 MG tablet Take 1 tablet (25 mg total) by mouth daily. Patient not taking: Reported on 10/20/2019 04/19/16   Dustin Flock, MD  folic acid (FOLVITE) 1 MG tablet Take 1 tablet (1 mg total) by mouth daily. 10/24/19   Nolberto Hanlon, MD  pantoprazole (PROTONIX) 40 MG tablet Take 1 tablet (40 mg total) by mouth daily. 10/24/19   Nolberto Hanlon, MD  thiamine 100 MG tablet Take 1 tablet (100 mg total) by mouth daily. 10/24/19   Nolberto Hanlon, MD    Inpatient Medications: Scheduled Meds:  Continuous Infusions:  PRN Meds:   Allergies:   No Known Allergies  Social History:   Social History   Socioeconomic  History   Marital status: Single    Spouse name: Not on file   Number of children: Not on file   Years of education: Not on file   Highest education level: Not on file  Occupational History   Not on file  Tobacco Use   Smoking status: Every Day    Packs/day: 0.50    Years: 20.00    Total pack years: 10.00    Types: Cigarettes   Smokeless tobacco: Never  Vaping Use   Vaping Use: Not on file  Substance and Sexual Activity   Alcohol use: Yes    Comment: beer   Drug use: No   Sexual activity: Not on file  Other Topics Concern   Not on file   Social History Narrative   Not on file   Social Determinants of Health   Financial Resource Strain: Not on file  Food Insecurity: Not on file  Transportation Needs: Not on file  Physical Activity: Not on file  Stress: Not on file  Social Connections: Not on file  Intimate Partner Violence: Not on file    Family History:   Reviewed Family History  Problem Relation Age of Onset   Hypertension Mother    Heart disease Father    Heart attack Brother      ROS:  Please see the history of present illness.  Denies any nausea vomiting or loose stools.  No melena, hematochezia hematuria. No coughing wheezing sneezing.  No fevers chills. All other ROS reviewed and negative.     Physical Exam/Data:   Vitals:   07/10/22 1640 07/10/22 1645 07/10/22 1650 07/10/22 1655  BP: (!) 178/102 (!) 175/105 (!) 175/107 (!) 156/108  Pulse: (!) 109 (!) 104 (!) 108 (!) 103  Resp: (!) 22 (!) 24 19 (!) 22  Temp:      TempSrc:      SpO2: 94% 94% 96% 97%   No intake or output data in the 24 hours ending 07/10/22 1707    10/22/2019    9:22 AM 10/21/2019    1:14 AM 10/20/2019    6:26 PM  Last 3 Weights  Weight (lbs) 164 lb 0.4 oz 164 lb 0.4 oz 164 lb  Weight (kg) 74.4 kg 74.4 kg 74.39 kg     There is no height or weight on file to calculate BMI.  Estimated 68 kg General:  Well nourished, well developed, in no acute distress, says his pain is may be 1 out of 10 at this point. HEENT: normal Neck: no JVD Vascular: No carotid bruits; Distal pulses 2+ bilaterally Cardiac:  normal S1, S2; tachycardic RR; no murmur rubs or gallops: Borderline hyperdynamic precordium Lungs:  clear to auscultation bilaterally, no wheezing, rhonchi or rales  Abd: soft, nontender, no hepatomegaly  Ext: no edema Musculoskeletal:  No deformities, BUE and BLE strength normal and equal Skin: warm and dry  Neuro:  CNs 2-12 intact, no focal abnormalities noted Psych:  Normal affect   EKG:  The EKG was personally reviewed and  demonstrates: Sinus tachycardia rate 103 bpm.  ST elevations noted in V 3 and V4 most prominently with subtle elevations in V2 and V5.  No depressions noted.  Fractionated QRS complexes in the inferior leads. Telemetry:  Telemetry was personally reviewed and demonstrates: Sinus tachycardia  Relevant CV Studies: None  Laboratory Data:  High Sensitivity Troponin:   Recent Labs  Lab 07/10/22 1615  TROPONINIHS 9     Chemistry Recent Labs  Lab 07/10/22 1615  NA 127*  K 4.2  CL 91*  CO2 25  GLUCOSE 111*  BUN 6*  CREATININE 0.56*  CALCIUM 9.7  GFRNONAA >60  ANIONGAP 11    Recent Labs  Lab 07/10/22 1615  PROT 8.8*  ALBUMIN 4.6  AST 42*  ALT 60*  ALKPHOS 123  BILITOT 1.3*   Lipids  Recent Labs  Lab 07/10/22 1615  CHOL 192  TRIG 47  HDL 100  LDLCALC 83  CHOLHDL 1.9    Hematology Recent Labs  Lab 07/10/22 1615  WBC 8.1  RBC 4.88  HGB 16.5  HCT 47.9  MCV 98.2  MCH 33.8  MCHC 34.4  RDW 11.9  PLT 150   Thyroid No results for input(s): "TSH", "FREET4" in the last 168 hours.  BNPNo results for input(s): "BNP", "PROBNP" in the last 168 hours.  DDimer No results for input(s): "DDIMER" in the last 168 hours.   Radiology/Studies:  No results found.   Assessment and Plan:   Anterolateral STE on EKG - Concern for Anterolateral STEMI Accelerated hypertension Long-term smoker  Given 4000 units IV heparin along with NTG and 5 mg IV Lopressor  We will take him to cardiac catheterization lab for angiography and possible PCI.  Further plans based on results.  We will need to treat blood pressure-obviously beta-blocker plus or minus ARB depending renal function. Check 2D echocardiogram tomorrow. Spoke cessation counseling Statin   Shared Decision Making/Informed Consent{  The risks [stroke (1 in 1000), death (1 in 51), kidney failure [usually temporary] (1 in 500), bleeding (1 in 200), allergic reaction [possibly serious] (1 in 200)], benefits  (diagnostic support and management of coronary artery disease) and alternatives of a cardiac catheterization were discussed in detail with Dillon Castillo and he is willing to proceed.   ADDENDUM Patient was taken to cardiac catheterization lab was found to have angiographically normal coronary arteries with normal LV function and normal LVEDP.  Suspect that the NT elevations may very well simply be related to LVH.  He needs 2D echocardiogram and blood pressure control.  Also noted to have hyponatremia on labs.  Will defer evaluation to primary team. I have contacted the [facility] team for admission.  We will follow-up tomorrow.   Risk Assessment/Risk Scores:     TIMI Risk Score for ST  Elevation MI:   The patient's TIMI risk score is 4, which indicates a 7.3% risk of all cause mortality at 30 days.           For questions or updates, please contact [place] Please consult [email or web address] for contact info under    Signed, Glenetta Hew, MD  07/10/2022 5:07 PM

## 2022-07-10 NOTE — H&P (View-Only) (Signed)
Cardiology Consultation   Patient ID: MOHAN ERVEN MRN: [id number]; DOB: [birth year]  Admit date: 07/10/2022 Date of Consult: 07/10/2022  PCP:  Patient, No Pcp Per   [place] Providers Cardiologist:  None        Patient Profile:   Dillon Castillo is a 61 y.o. male with a hx of essential hypertension (not on medications), and history of GI bleed in 2021 who is being seen 07/10/2022 for the evaluation of possible anterior STEMI with accelerated hypertension at the request of Dr. Orma Render EDP.  History of Present Illness:   Dillon Castillo stated that he was in his usual state of health having recovered from his GI bleed 2021.  No major issues.  However this morning at 1 AM 05/10/2022 he started having epigastric and chest pain when he woke up to go to the bathroom.  He was unable to get back to sleep again.  He felt uncomfortable throughout the night.  Pain at worst was maybe 8/10.  When it did not go away by this afternoon roughly 3 PM, he chose to contact EMS.  He was brought to [facility] emergency room-arriving at 1620.  Initial EKG suggested possible ST elevations, but was not overly conclusive per EDP.  Repeat EKGs showed persistent findings and therefore code STEMI was called.  Of note, he is also significant hypertensive with blood pressure in the 200s over 100s with heart rate in the 100 range.Marland Kitchen  Upon my arrival to the ER patient was almost pain-free.  Resting comfortably in bed.  He did receive 4000 with IV heparin.  Remained tachycardic and still hypertensive but not to the same extent.  I ordered 5 mg IV Lopressor and 1 inch NTG paste.  Labs been drawn but none are back yet.   Past Medical History:  Diagnosis Date   Gout    Heart murmur    Hypertension    Polysubstance abuse ([place])    a. ongoing tobacco and etoh abuse; b. remote marijuana abuse in his 20's    Past Surgical History:  Procedure Laterality Date   APPENDECTOMY     COLONOSCOPY WITH PROPOFOL N/A  06/21/2016   Procedure: COLONOSCOPY WITH PROPOFOL;  Surgeon: [name], MD;  Location: [facility] ENDOSCOPY;  Service: Endoscopy;  Laterality: N/A;   ESOPHAGOGASTRODUODENOSCOPY (EGD) WITH PROPOFOL N/A 04/19/2016   Procedure: ESOPHAGOGASTRODUODENOSCOPY (EGD) WITH PROPOFOL;  Surgeon: [name], MD;  Location: [facility] ENDOSCOPY;  Service: Endoscopy;  Laterality: N/A;   ESOPHAGOGASTRODUODENOSCOPY (EGD) WITH PROPOFOL N/A 10/22/2019   Procedure: ESOPHAGOGASTRODUODENOSCOPY (EGD) WITH PROPOFOL;  Surgeon: [name], MD;  Location: [facility] ENDOSCOPY;  Service: Endoscopy;  Laterality: N/A;     Home Medications: Actually not taking any medications. Prior to Admission medications   Medication Sig Start Date End Date Taking? Authorizing Provider  atenolol (TENORMIN) 25 MG tablet Take 1 tablet (25 mg total) by mouth daily. Patient not taking: Reported on 10/20/2019 04/19/16   Dustin Flock, MD  folic acid (FOLVITE) 1 MG tablet Take 1 tablet (1 mg total) by mouth daily. 10/24/19   Nolberto Hanlon, MD  pantoprazole (PROTONIX) 40 MG tablet Take 1 tablet (40 mg total) by mouth daily. 10/24/19   Nolberto Hanlon, MD  thiamine 100 MG tablet Take 1 tablet (100 mg total) by mouth daily. 10/24/19   Nolberto Hanlon, MD    Inpatient Medications: Scheduled Meds:  Continuous Infusions:  PRN Meds:   Allergies:   No Known Allergies  Social History:   Social History   Socioeconomic  History   Marital status: Single    Spouse name: Not on file   Number of children: Not on file   Years of education: Not on file   Highest education level: Not on file  Occupational History   Not on file  Tobacco Use   Smoking status: Every Day    Packs/day: 0.50    Years: 20.00    Total pack years: 10.00    Types: Cigarettes   Smokeless tobacco: Never  Vaping Use   Vaping Use: Not on file  Substance and Sexual Activity   Alcohol use: Yes    Comment: beer   Drug use: No   Sexual activity: Not on file  Other Topics Concern   Not on file   Social History Narrative   Not on file   Social Determinants of Health   Financial Resource Strain: Not on file  Food Insecurity: Not on file  Transportation Needs: Not on file  Physical Activity: Not on file  Stress: Not on file  Social Connections: Not on file  Intimate Partner Violence: Not on file    Family History:   Reviewed Family History  Problem Relation Age of Onset   Hypertension Mother    Heart disease Father    Heart attack Brother      ROS:  Please see the history of present illness.  Denies any nausea vomiting or loose stools.  No melena, hematochezia hematuria. No coughing wheezing sneezing.  No fevers chills. All other ROS reviewed and negative.     Physical Exam/Data:   Vitals:   07/10/22 1640 07/10/22 1645 07/10/22 1650 07/10/22 1655  BP: (!) 178/102 (!) 175/105 (!) 175/107 (!) 156/108  Pulse: (!) 109 (!) 104 (!) 108 (!) 103  Resp: (!) 22 (!) 24 19 (!) 22  Temp:      TempSrc:      SpO2: 94% 94% 96% 97%   No intake or output data in the 24 hours ending 07/10/22 1707    10/22/2019    9:22 AM 10/21/2019    1:14 AM 10/20/2019    6:26 PM  Last 3 Weights  Weight (lbs) 164 lb 0.4 oz 164 lb 0.4 oz 164 lb  Weight (kg) 74.4 kg 74.4 kg 74.39 kg     There is no height or weight on file to calculate BMI.  Estimated 68 kg General:  Well nourished, well developed, in no acute distress, says his pain is may be 1 out of 10 at this point. HEENT: normal Neck: no JVD Vascular: No carotid bruits; Distal pulses 2+ bilaterally Cardiac:  normal S1, S2; tachycardic RR; no murmur rubs or gallops: Borderline hyperdynamic precordium Lungs:  clear to auscultation bilaterally, no wheezing, rhonchi or rales  Abd: soft, nontender, no hepatomegaly  Ext: no edema Musculoskeletal:  No deformities, BUE and BLE strength normal and equal Skin: warm and dry  Neuro:  CNs 2-12 intact, no focal abnormalities noted Psych:  Normal affect   EKG:  The EKG was personally reviewed and  demonstrates: Sinus tachycardia rate 103 bpm.  ST elevations noted in V 3 and V4 most prominently with subtle elevations in V2 and V5.  No depressions noted.  Fractionated QRS complexes in the inferior leads. Telemetry:  Telemetry was personally reviewed and demonstrates: Sinus tachycardia  Relevant CV Studies: None  Laboratory Data:  High Sensitivity Troponin:   Recent Labs  Lab 07/10/22 1615  TROPONINIHS 9     Chemistry Recent Labs  Lab 07/10/22 1615  NA 127*  K 4.2  CL 91*  CO2 25  GLUCOSE 111*  BUN 6*  CREATININE 0.56*  CALCIUM 9.7  GFRNONAA >60  ANIONGAP 11    Recent Labs  Lab 07/10/22 1615  PROT 8.8*  ALBUMIN 4.6  AST 42*  ALT 60*  ALKPHOS 123  BILITOT 1.3*   Lipids  Recent Labs  Lab 07/10/22 1615  CHOL 192  TRIG 47  HDL 100  LDLCALC 83  CHOLHDL 1.9    Hematology Recent Labs  Lab 07/10/22 1615  WBC 8.1  RBC 4.88  HGB 16.5  HCT 47.9  MCV 98.2  MCH 33.8  MCHC 34.4  RDW 11.9  PLT 150   Thyroid No results for input(s): "TSH", "FREET4" in the last 168 hours.  BNPNo results for input(s): "BNP", "PROBNP" in the last 168 hours.  DDimer No results for input(s): "DDIMER" in the last 168 hours.   Radiology/Studies:  No results found.   Assessment and Plan:   Anterolateral STE on EKG - Concern for Anterolateral STEMI Accelerated hypertension Long-term smoker  Given 4000 units IV heparin along with NTG and 5 mg IV Lopressor  We will take him to cardiac catheterization lab for angiography and possible PCI.  Further plans based on results.  We will need to treat blood pressure-obviously beta-blocker plus or minus ARB depending renal function. Check 2D echocardiogram tomorrow. Spoke cessation counseling Statin   Shared Decision Making/Informed Consent{  The risks [stroke (1 in 1000), death (1 in 86), kidney failure [usually temporary] (1 in 500), bleeding (1 in 200), allergic reaction [possibly serious] (1 in 200)], benefits  (diagnostic support and management of coronary artery disease) and alternatives of a cardiac catheterization were discussed in detail with Dillon Castillo and he is willing to proceed.   ADDENDUM Patient was taken to cardiac catheterization lab was found to have angiographically normal coronary arteries with normal LV function and normal LVEDP.  Suspect that the NT elevations may very well simply be related to LVH.  He needs 2D echocardiogram and blood pressure control.  Also noted to have hyponatremia on labs.  Will defer evaluation to primary team. I have contacted the [facility] team for admission.  We will follow-up tomorrow.   Risk Assessment/Risk Scores:     TIMI Risk Score for ST  Elevation MI:   The patient's TIMI risk score is 4, which indicates a 7.3% risk of all cause mortality at 30 days.           For questions or updates, please contact [place] Please consult [email or web address] for contact info under    Signed, Glenetta Hew, MD  07/10/2022 5:07 PM

## 2022-07-10 NOTE — Assessment & Plan Note (Signed)
Patient not on medication at admission.  Plan Per Dr. Ellyn Hack - coreg daily  Cardiology followup  Will need PCP

## 2022-07-10 NOTE — Assessment & Plan Note (Signed)
At admission Na 127  Plan Patient post cath - hydration with NS at 100 cc/hr  Bmet in AM

## 2022-07-10 NOTE — Progress Notes (Signed)
TR band removed per protocol, no hematoma or bleeding at site, gauze and tegaderm dressing placed. Will continue to monitor

## 2022-07-11 ENCOUNTER — Encounter: Payer: Self-pay | Admitting: Cardiology

## 2022-07-11 DIAGNOSIS — R9431 Abnormal electrocardiogram [ECG] [EKG]: Secondary | ICD-10-CM

## 2022-07-11 DIAGNOSIS — E871 Hypo-osmolality and hyponatremia: Secondary | ICD-10-CM

## 2022-07-11 DIAGNOSIS — I161 Hypertensive emergency: Secondary | ICD-10-CM

## 2022-07-11 DIAGNOSIS — Z720 Tobacco use: Secondary | ICD-10-CM | POA: Insufficient documentation

## 2022-07-11 DIAGNOSIS — F1010 Alcohol abuse, uncomplicated: Secondary | ICD-10-CM | POA: Insufficient documentation

## 2022-07-11 LAB — LIPID PANEL
Cholesterol: 155 mg/dL (ref 0–200)
HDL: 74 mg/dL (ref >40)
LDL Cholesterol: 70 mg/dL (ref 0–99)
Total CHOL/HDL Ratio: 2.1 RATIO
Triglycerides: 53 mg/dL (ref <150)
VLDL: 11 mg/dL (ref 0–40)

## 2022-07-11 LAB — BASIC METABOLIC PANEL
Anion gap: 4 — ABNORMAL LOW (ref 5–15)
BUN: 10 mg/dL (ref 8–23)
CO2: 25 mmol/L (ref 22–32)
Calcium: 9.3 mg/dL (ref 8.9–10.3)
Chloride: 106 mmol/L (ref 98–111)
Creatinine, Ser: 0.57 mg/dL — ABNORMAL LOW (ref 0.61–1.24)
GFR, Estimated: >60 mL/min (ref >60)
Glucose, Bld: 107 mg/dL — ABNORMAL HIGH (ref 70–99)
Potassium: 3.9 mmol/L (ref 3.5–5.1)
Sodium: 135 mmol/L (ref 135–145)

## 2022-07-11 LAB — HEMOGLOBIN A1C
Hgb A1c MFr Bld: 5.4 % (ref 4.8–5.6)
Mean Plasma Glucose: 108.28 mg/dL

## 2022-07-11 LAB — TROPONIN I (HIGH SENSITIVITY): Troponin I (High Sensitivity): 9 ng/L (ref <18)

## 2022-07-11 LAB — HIV ANTIBODY (ROUTINE TESTING W REFLEX): HIV Screen 4th Generation wRfx: NONREACTIVE

## 2022-07-11 LAB — ECHOCARDIOGRAM COMPLETE
Area-P 1/2: 3.84 cm2
S' Lateral: 2.9 cm
Weight: 2172.85 oz

## 2022-07-11 MED ORDER — AMLODIPINE BESYLATE 5 MG PO TABS: 5.0000 mg | ORAL_TABLET | Freq: Every day | ORAL | 0 refills | Status: AC

## 2022-07-11 MED ORDER — ATENOLOL 25 MG PO TABS: 25.0000 mg | ORAL_TABLET | Freq: Every day | ORAL | 0 refills | Status: AC

## 2022-07-11 NOTE — Discharge Summary (Signed)
Physician Discharge Summary   Patient: Dillon Castillo MRN: [id number] DOB: 18-Jul-1961  Admit date:     07/10/2022  Discharge date: 07/11/22  Discharge Physician: Sharen Hones   PCP: Patient, No Pcp Per   Recommendations at discharge:   Follow-up with PCP in 1 week.  TOC to set up.  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 [facility] Course: Dillon Castillo, a 61 y/o with h/o GI bleed, HTN on no medication, woke up in the night with chest discomfort.  Upon arriving the emergency room, he has significant elevation blood pressure over 200, troponin was negative.  However EKG had minimal ST elevation in V3 to V5.  Emergent brought to the Cath Lab, coronary angiogram did not show any coronary disease.  STEMI ruled out. Echocardiogram performed, showed LV hypertrophy with normal ejection fraction. Patient condition is consistent with hypertension emergency.  He was placed on beta-blocker, blood pressure is running better but still high.  At this point, he is symptom-free, medically stable to be discharged.  TOC involved to set up follow-up with PCP as outpatient.  Assessment and Plan: Hypertension emergency, POA, ruled in. ST elevation, ruled out STEMI. Patient seem to be improved, at this point, I will restart prior medication (not taking) atenolol, also add amlodipine.  He will follow-up with PCP which will be set up by TOC.  Hyponatremia. Alcohol abuse. Alcoholic hepatitis. Hyponatremia appears to be secondary to heavy beer drinking.  Sodium level is better after giving normal saline.  Advised to quit alcohol drinking.  Patient did not have any alcohol withdrawal.  Tobacco  abuse. Marijuana use. Advised to quit.      Consultants: Cardiology Procedures performed: Coronary angiogram. Disposition: Home Diet recommendation:  Discharge Diet Orders (From admission, onward)     Start     Ordered   07/11/22 0000  Diet - low sodium heart healthy        07/11/22 0947           Cardiac diet DISCHARGE MEDICATION: Allergies as of 07/11/2022   No Known Allergies      Medication List     STOP taking these medications    thiamine 100 MG tablet Commonly known as: VITAMIN B1       TAKE these medications    amLODipine 5 MG tablet Commonly known as: NORVASC Take 1 tablet (5 mg total) by mouth daily.   atenolol 25 MG tablet Commonly known as: TENORMIN Take 1 tablet (25 mg total) by mouth daily.   folic acid 1 MG tablet Commonly known as: FOLVITE Take 1 tablet (1 mg total) by mouth daily.   pantoprazole 40 MG tablet Commonly known as: PROTONIX Take 1 tablet (40 mg total) by mouth daily.        Discharge Exam: Filed Weights   07/10/22 1806  Weight: 61.6 kg   General exam: Appears calm and comfortable  Respiratory system: Clear to auscultation. Respiratory effort normal. Cardiovascular system: S1 & S2 heard, RRR. No JVD, murmurs, rubs, gallops or clicks. No pedal edema. Gastrointestinal system: Abdomen is nondistended, soft and nontender. No organomegaly or masses felt. Normal bowel sounds heard. Central nervous system: Alert and oriented. No  focal neurological deficits. Extremities: Symmetric 5 x 5 power. Skin: No rashes, lesions or ulcers Psychiatry: Judgement and insight appear normal. Mood & affect appropriate.    Condition at discharge: good  The results of significant diagnostics from this hospitalization (including imaging, microbiology, ancillary and laboratory) are listed below for reference.   Imaging Studies: ECHOCARDIOGRAM COMPLETE  Result Date: 07/11/2022    ECHOCARDIOGRAM REPORT   Patient Name:   Dillon Castillo Date of Exam: 07/10/2022 Medical Rec #:  [id number]        Height:       69.0 in Accession #:    [id number]       Weight:       135.8 lb Date of Birth:  [birth year]         BSA:          1.753 m Patient Age:    61 years         BP:           161/98 mmHg Patient Gender: M                HR:           92 bpm. Exam Location:  [facility] Procedure: 2D Echo, 3D Echo, Cardiac Doppler and Color Doppler Indications:     I21.9 Acute Myocardial Infarction  History:         Patient has prior history of Echocardiogram examinations, most                  recent 04/18/2016. Signs/Symptoms:Murmur; Risk                  Factors:Hypertension.  Sonographer:     Cresenciano Lick RDCS Referring Phys:  [place] Diagnosing Phys: Skeet Latch MD IMPRESSIONS  1. Severe septal hypertrophy with otherwise moderate concentric LVH. Left ventricular ejection fraction, by estimation, is 60 to 65%. The left ventricle has normal function. The left ventricle has no regional wall motion abnormalities. There is moderate  concentric left ventricular hypertrophy. Left ventricular diastolic parameters are consistent with Grade I diastolic dysfunction (impaired relaxation).  2. Right ventricular systolic function is normal. The right ventricular size is normal.  3. Left atrial size was mildly dilated.  4. The mitral valve is normal in structure. Mild mitral valve regurgitation. No evidence of mitral stenosis.  5. The aortic valve is tricuspid. Aortic valve regurgitation is not visualized. No aortic stenosis is present.  6. The inferior vena cava is normal in size with greater than 50% respiratory variability, suggesting right atrial pressure of 3 mmHg. FINDINGS  Left Ventricle: Severe septal hypertrophy with otherwise moderate concentric LVH. Left ventricular ejection fraction, by estimation, is 60 to 65%. The left ventricle has normal function. The left ventricle has no regional wall motion abnormalities. The left ventricular internal  cavity size was normal in size. There is moderate concentric left ventricular hypertrophy. Left ventricular diastolic parameters are consistent with Grade I diastolic dysfunction (impaired relaxation). Normal left ventricular filling pressure. Right Ventricle: The right ventricular size is normal. No increase in right ventricular wall thickness. Right ventricular systolic function is normal. Left Atrium: Left atrial size was mildly dilated. Right Atrium: Right atrial size was normal in size. Pericardium: There is no evidence of pericardial effusion. Mitral Valve: The mitral valve is normal in structure. Mild mitral valve regurgitation. No evidence of mitral valve stenosis. Tricuspid Valve: The tricuspid valve is normal in structure. Tricuspid valve regurgitation is mild . No  evidence of tricuspid stenosis. Aortic Valve: The aortic valve is tricuspid. Aortic valve regurgitation is not visualized. No aortic stenosis is present. Pulmonic Valve: The pulmonic valve was normal in structure. Pulmonic valve regurgitation is not visualized. No evidence of pulmonic stenosis. Aorta: The aortic root is normal in size and structure. Venous: The inferior vena cava is normal in size with greater than 50% respiratory variability, suggesting right atrial pressure of 3 mmHg. IAS/Shunts: No atrial level shunt detected by color flow Doppler.  LEFT VENTRICLE PLAX 2D LVIDd:         4.20 cm   Diastology LVIDs:         2.90 cm   LV e' medial:    7.18 cm/s LV PW:         1.50 cm   LV E/e' medial:  7.1 LV IVS:        1.70 cm   LV e' lateral:   8.81 cm/s LVOT diam:     2.00 cm   LV E/e' lateral: 5.8 LV SV:         49 LV SV Index:   28 LVOT Area:     3.14 cm                           3D Volume EF:                          3D EF:        54 %                          LV EDV:       150 ml                          LV ESV:       70 ml                          LV SV:        80 ml RIGHT VENTRICLE             IVC RV Basal diam:  3.50 cm     IVC diam:  1.50 cm RV S prime:     10.65 cm/s TAPSE (M-mode): 1.6 cm LEFT ATRIUM             Index        RIGHT ATRIUM           Index LA diam:        3.70 cm 2.11 cm/m   RA Area:     11.70 cm LA Vol (A2C):   59.7 ml 34.06 ml/m  RA Volume:   30.00 ml  17.12 ml/m LA Vol (A4C):   35.3 ml 20.14 ml/m LA Biplane Vol: 46.6 ml 26.59 ml/m  AORTIC VALVE LVOT Vmax:   90.50 cm/s LVOT Vmean:  62.433 cm/s LVOT VTI:    0.155 m  AORTA Ao Root diam: 3.80 cm Ao Asc diam:  3.40 cm MITRAL VALVE MV Area (PHT): 3.84 cm    SHUNTS MV Decel Time: 198 msec    Systemic VTI:  0.16 m MV E velocity: 51.10 cm/s  Systemic Diam: 2.00 cm MV A velocity: 85.60 cm/s MV E/A ratio:  0.60 Skeet Latch MD Electronically signed by Skeet Latch MD Signature Date/Time: 07/11/2022/5:46:59 AM  Final    DG Chest Port 1 View  Result Date: 07/10/2022 CLINICAL DATA:  Rhonchi at the lung bases EXAM: PORTABLE CHEST 1 VIEW COMPARISON:  10/20/2019 FINDINGS: Lungs are clear.  No pleural effusion or pneumothorax. The heart is normal in size.  Thoracic aortic atherosclerosis. IMPRESSION: No evidence of acute cardiopulmonary disease. Electronically Signed   By: Julian Hy M.D.   On: 07/10/2022 21:37   CARDIAC CATHETERIZATION  Result Date: 07/10/2022 Accelerated hypertension with ST elevations on EKG-not consistent with STEMI based on anatomy. Angiographically Minimal CAD - with no evidence of obstructive coronary disease, however there is tortuosity in the coronaries indicating hypertension Hyperdynamic LV with EF of 65% and EDP of 12-13 mmHg despite systemic hypertension. RECOMMENDATIONS Admit for overnight monitoring and TR band removal. Check 2D echocardiogram-would also need to exclude pericarditis as the reason for chest pain. Have started carvedilol 6.25 mg twice daily. FLP and A1c checked for risk factor modification. Smoking cessation counseling.    Microbiology: Results for orders placed or performed during the hospital encounter of  07/10/22  MRSA Next Gen by PCR, Nasal     Status: None   Collection Time: 07/10/22  6:15 PM   Specimen: Nasal Mucosa; Nasal Swab  Result Value Ref Range Status   MRSA by PCR Next Gen NOT DETECTED NOT DETECTED Final    Comment: (NOTE) The GeneXpert MRSA Assay (FDA approved for NASAL specimens only), is one component of a comprehensive MRSA colonization surveillance program. It is not intended to diagnose MRSA infection nor to guide or monitor treatment for MRSA infections. Test performance is not FDA approved in patients less than 15 years old. Performed at [facility], [address] [place], [place] [address]     Labs: CBC: Recent Labs  Lab 07/10/22 1615 07/10/22 1731  WBC 8.1  --   NEUTROABS 5.5  --   HGB 16.5 15.0  HCT 47.9 44.0  MCV 98.2  --   PLT 150  --    Basic Metabolic Panel: Recent Labs  Lab 07/10/22 1615 07/10/22 1731 07/11/22 0643  NA 127* 127* 135  K 4.2 3.5 3.9  CL 91*  --  106  CO2 25  --  25  GLUCOSE 111*  --  107*  BUN 6*  --  10  CREATININE 0.56*  --  0.57*  CALCIUM 9.7  --  9.3   Liver Function Tests: Recent Labs  Lab 07/10/22 1615  AST 42*  ALT 60*  ALKPHOS 123  BILITOT 1.3*  PROT 8.8*  ALBUMIN 4.6   CBG: Recent Labs  Lab 07/10/22 1807  GLUCAP 88    Discharge time spent: greater than 30 minutes.  Signed: Sharen Hones, MD [facility] 07/11/2022

## 2022-07-11 NOTE — Progress Notes (Signed)
All discharge instructions given to and discussed with pt. Pt verbalizing understanding. Medications to be picked up on the way home, pt's sister on the way to pick pt up. Pt to be discharged to private home. VSS.

## 2022-07-12 LAB — LIPOPROTEIN A (LPA): Lipoprotein (a): 59.1 nmol/L — ABNORMAL HIGH (ref <75.0)
